# Patient Record
Sex: Male | Born: 1990 | Race: White | Hispanic: No | Marital: Married | Smoking: Former smoker
Health system: Southern US, Community
[De-identification: ages and names within clinical notes are randomized; demographics above are authoritative.]

## PROBLEM LIST (undated history)

## (undated) DIAGNOSIS — F09 Unspecified mental disorder due to known physiological condition: Secondary | ICD-10-CM

## (undated) DIAGNOSIS — E291 Testicular hypofunction: Secondary | ICD-10-CM

## (undated) DIAGNOSIS — K219 Gastro-esophageal reflux disease without esophagitis: Secondary | ICD-10-CM

## (undated) DIAGNOSIS — I10 Essential (primary) hypertension: Secondary | ICD-10-CM

## (undated) DIAGNOSIS — N62 Hypertrophy of breast: Secondary | ICD-10-CM

## (undated) DIAGNOSIS — R4189 Other symptoms and signs involving cognitive functions and awareness: Secondary | ICD-10-CM

## (undated) DIAGNOSIS — E669 Obesity, unspecified: Secondary | ICD-10-CM

## (undated) DIAGNOSIS — R1013 Epigastric pain: Secondary | ICD-10-CM

## (undated) DIAGNOSIS — E049 Nontoxic goiter, unspecified: Secondary | ICD-10-CM

## (undated) DIAGNOSIS — E7801 Familial hypercholesterolemia: Secondary | ICD-10-CM

## (undated) DIAGNOSIS — R7303 Prediabetes: Secondary | ICD-10-CM

## (undated) DIAGNOSIS — E300 Delayed puberty: Secondary | ICD-10-CM

## (undated) DIAGNOSIS — L83 Acanthosis nigricans: Secondary | ICD-10-CM

## (undated) DIAGNOSIS — G930 Cerebral cysts: Secondary | ICD-10-CM

## (undated) HISTORY — DX: Testicular hypofunction: E29.1

## (undated) HISTORY — DX: Cerebral cysts: G93.0

## (undated) HISTORY — DX: Other symptoms and signs involving cognitive functions and awareness: R41.89

## (undated) HISTORY — DX: Epigastric pain: R10.13

## (undated) HISTORY — PX: EYE MUSCLE SURGERY: SHX370

## (undated) HISTORY — DX: Acanthosis nigricans: L83

## (undated) HISTORY — DX: Prediabetes: R73.03

## (undated) HISTORY — DX: Obesity, unspecified: E66.9

## (undated) HISTORY — DX: Familial hypercholesterolemia: E78.01

## (undated) HISTORY — PX: ORCHIOPEXY: SHX479

## (undated) HISTORY — DX: Unspecified mental disorder due to known physiological condition: F09

## (undated) HISTORY — DX: Essential (primary) hypertension: I10

## (undated) HISTORY — DX: Delayed puberty: E30.0

## (undated) HISTORY — DX: Gastro-esophageal reflux disease without esophagitis: K21.9

## (undated) HISTORY — DX: Hypertrophy of breast: N62

## (undated) HISTORY — DX: Nontoxic goiter, unspecified: E04.9

---
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
## 2010-02-25 ENCOUNTER — Ambulatory Visit: Payer: Self-pay | Admitting: "Endocrinology

## 2010-08-27 ENCOUNTER — Ambulatory Visit: Payer: Self-pay | Admitting: "Endocrinology

## 2010-10-11 NOTE — Op Note (Signed)
NAME:  Stephen Miles, Stephen Miles                        ACCOUNT NO.:  [id number]   MEDICAL RECORD NO.:  [id number]                   PATIENT TYPE:  AMB   LOCATION:  DSC                                  FACILITY:  [facility]   PHYSICIAN:  Pasty Spillers. Maple Hudson, M.D.              DATE OF BIRTH:  [birth year]   DATE OF PROCEDURE:  06/16/2003  DATE OF DISCHARGE:                                 OPERATIVE REPORT   PREOPERATIVE DIAGNOSIS:  Exotropia.   POSTOPERATIVE DIAGNOSIS:  Exotropia.   PROCEDURE:  Lateral rectus muscle recession, 8.5 mm OU.   SURGEON:  Pasty Spillers. Maple Hudson, M.D.   ANESTHESIA:  General (laryngeal mask).   COMPLICATIONS:  None.   DESCRIPTION OF PROCEDURE:  After routine preoperative evaluation including  informed consent from the parents, the patient was taken to the operating  room where he was identified by me.  General anesthesia was induced without  difficulty after placement of appropriate monitors.  The patient was prepped  and draped in standard sterile fashion.  A lid speculum was placed in the  right eye.   Through an inferotemporal fornix incision through conjunctiva and Tenon's  fascia, the right lateral rectus muscle was engaged on a series of muscle  hooks and carefully cleared of its fascial attachments.  The tendon was  secured with a double armed 6-0 Vicryl suture, the double locking bite at  each border of the tendon.  The muscle was disinserted and was reattached to  sclera at a measured distance of 8.5 mm posterior to the original insertion,  using direct scleral passes in cross swords fashion.  The suture ends were  tied securely after the position of the muscle had been checked and found to  be accurate.  The conjunctiva was closed with two interrupted 6-0 Vicryl  sutures.  The lid speculum was transferred to the left eye, an identical  procedure was performed, again effecting an 8.5 mm recession of the lateral  rectus muscle.  TobraDex ointment was placed in each  eye.  The patient was  awakened without difficulty and taken to the recovery room in stable  condition, having suffered no interoperative or immediate postoperative  complications.                                               Pasty Spillers. Maple Hudson, M.D.    Cheron Schaumann  D:  06/16/2003  T:  06/16/2003  Job:  [id number]

## 2010-11-11 ENCOUNTER — Encounter: Payer: Self-pay | Admitting: *Deleted

## 2010-11-11 DIAGNOSIS — R7303 Prediabetes: Secondary | ICD-10-CM | POA: Insufficient documentation

## 2010-11-11 DIAGNOSIS — E669 Obesity, unspecified: Secondary | ICD-10-CM | POA: Insufficient documentation

## 2010-11-11 DIAGNOSIS — E049 Nontoxic goiter, unspecified: Secondary | ICD-10-CM | POA: Insufficient documentation

## 2010-11-11 DIAGNOSIS — I10 Essential (primary) hypertension: Secondary | ICD-10-CM | POA: Insufficient documentation

## 2010-12-10 ENCOUNTER — Ambulatory Visit (INDEPENDENT_AMBULATORY_CARE_PROVIDER_SITE_OTHER): Payer: Medicaid Other | Admitting: "Endocrinology

## 2010-12-10 VITALS — BP 148/76 | HR 80 | Wt 273.6 lb

## 2010-12-10 DIAGNOSIS — E669 Obesity, unspecified: Secondary | ICD-10-CM

## 2010-12-10 DIAGNOSIS — R7303 Prediabetes: Secondary | ICD-10-CM

## 2010-12-10 DIAGNOSIS — I10 Essential (primary) hypertension: Secondary | ICD-10-CM

## 2010-12-10 DIAGNOSIS — E782 Mixed hyperlipidemia: Secondary | ICD-10-CM

## 2010-12-10 DIAGNOSIS — L83 Acanthosis nigricans: Secondary | ICD-10-CM

## 2010-12-10 DIAGNOSIS — E049 Nontoxic goiter, unspecified: Secondary | ICD-10-CM

## 2010-12-10 DIAGNOSIS — R7309 Other abnormal glucose: Secondary | ICD-10-CM

## 2010-12-10 MED ORDER — OMEPRAZOLE 40 MG PO CPDR: 40.0000 mg | DELAYED_RELEASE_CAPSULE | Freq: Every day | ORAL | Status: DC

## 2011-03-26 ENCOUNTER — Other Ambulatory Visit: Payer: Self-pay | Admitting: "Endocrinology

## 2011-03-27 LAB — T4, FREE: Free T4: 0.92 ng/dL (ref 0.80–1.80)

## 2011-03-27 LAB — LIPID PANEL
Cholesterol: 167 mg/dL (ref 0–200)
LDL Cholesterol: 92 mg/dL (ref 0–99)

## 2011-04-07 ENCOUNTER — Encounter: Payer: Self-pay | Admitting: "Endocrinology

## 2011-04-07 ENCOUNTER — Ambulatory Visit (INDEPENDENT_AMBULATORY_CARE_PROVIDER_SITE_OTHER): Payer: Medicare Other | Admitting: "Endocrinology

## 2011-04-07 VITALS — BP 142/86 | HR 89 | Wt 277.1 lb

## 2011-04-07 DIAGNOSIS — K219 Gastro-esophageal reflux disease without esophagitis: Secondary | ICD-10-CM

## 2011-04-07 DIAGNOSIS — R1013 Epigastric pain: Secondary | ICD-10-CM

## 2011-04-07 DIAGNOSIS — R7303 Prediabetes: Secondary | ICD-10-CM

## 2011-04-07 DIAGNOSIS — R7309 Other abnormal glucose: Secondary | ICD-10-CM

## 2011-04-07 DIAGNOSIS — I10 Essential (primary) hypertension: Secondary | ICD-10-CM

## 2011-04-07 DIAGNOSIS — E049 Nontoxic goiter, unspecified: Secondary | ICD-10-CM

## 2011-04-07 DIAGNOSIS — K3189 Other diseases of stomach and duodenum: Secondary | ICD-10-CM

## 2011-04-07 DIAGNOSIS — N62 Hypertrophy of breast: Secondary | ICD-10-CM

## 2011-04-07 DIAGNOSIS — E669 Obesity, unspecified: Secondary | ICD-10-CM

## 2011-04-07 LAB — POCT GLYCOSYLATED HEMOGLOBIN (HGB A1C): Hemoglobin A1C: 5.9

## 2011-04-07 NOTE — Patient Instructions (Addendum)
Followup visit in 4 months. Please try to exercise at least 60 minutes per day.

## 2011-06-01 ENCOUNTER — Encounter: Payer: Self-pay | Admitting: "Endocrinology

## 2011-06-01 DIAGNOSIS — E300 Delayed puberty: Secondary | ICD-10-CM | POA: Insufficient documentation

## 2011-06-01 DIAGNOSIS — R1013 Epigastric pain: Secondary | ICD-10-CM | POA: Insufficient documentation

## 2011-06-01 DIAGNOSIS — F09 Unspecified mental disorder due to known physiological condition: Secondary | ICD-10-CM | POA: Insufficient documentation

## 2011-06-01 DIAGNOSIS — R7303 Prediabetes: Secondary | ICD-10-CM | POA: Insufficient documentation

## 2011-06-01 DIAGNOSIS — E669 Obesity, unspecified: Secondary | ICD-10-CM | POA: Insufficient documentation

## 2011-06-01 DIAGNOSIS — E7801 Familial hypercholesterolemia: Secondary | ICD-10-CM | POA: Insufficient documentation

## 2011-06-01 DIAGNOSIS — K219 Gastro-esophageal reflux disease without esophagitis: Secondary | ICD-10-CM | POA: Insufficient documentation

## 2011-06-01 DIAGNOSIS — E049 Nontoxic goiter, unspecified: Secondary | ICD-10-CM | POA: Insufficient documentation

## 2011-06-01 DIAGNOSIS — G930 Cerebral cysts: Secondary | ICD-10-CM | POA: Insufficient documentation

## 2011-06-01 DIAGNOSIS — I10 Essential (primary) hypertension: Secondary | ICD-10-CM | POA: Insufficient documentation

## 2011-06-01 DIAGNOSIS — L83 Acanthosis nigricans: Secondary | ICD-10-CM | POA: Insufficient documentation

## 2011-06-01 DIAGNOSIS — N62 Hypertrophy of breast: Secondary | ICD-10-CM | POA: Insufficient documentation

## 2011-06-01 DIAGNOSIS — E291 Testicular hypofunction: Secondary | ICD-10-CM | POA: Insufficient documentation
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
## 2011-08-05 ENCOUNTER — Encounter: Payer: Self-pay | Admitting: "Endocrinology

## 2011-08-05 ENCOUNTER — Ambulatory Visit (INDEPENDENT_AMBULATORY_CARE_PROVIDER_SITE_OTHER): Payer: Medicare Other | Admitting: "Endocrinology

## 2011-08-05 VITALS — BP 137/44 | HR 75 | Wt 270.4 lb

## 2011-08-05 DIAGNOSIS — I10 Essential (primary) hypertension: Secondary | ICD-10-CM

## 2011-08-05 DIAGNOSIS — K3189 Other diseases of stomach and duodenum: Secondary | ICD-10-CM

## 2011-08-05 DIAGNOSIS — M25571 Pain in right ankle and joints of right foot: Secondary | ICD-10-CM

## 2011-08-05 DIAGNOSIS — R7303 Prediabetes: Secondary | ICD-10-CM

## 2011-08-05 DIAGNOSIS — R7309 Other abnormal glucose: Secondary | ICD-10-CM

## 2011-08-05 DIAGNOSIS — M25579 Pain in unspecified ankle and joints of unspecified foot: Secondary | ICD-10-CM

## 2011-08-05 DIAGNOSIS — N62 Hypertrophy of breast: Secondary | ICD-10-CM

## 2011-08-05 DIAGNOSIS — E049 Nontoxic goiter, unspecified: Secondary | ICD-10-CM

## 2011-08-05 DIAGNOSIS — R1013 Epigastric pain: Secondary | ICD-10-CM

## 2011-08-05 LAB — GLUCOSE, POCT (MANUAL RESULT ENTRY): POC Glucose: 84

## 2011-08-05 NOTE — Patient Instructions (Signed)
Followup visit in 4 months. Please continue the good work of reducing intake of sugars and starches. Please call Dr. Aldean Baker at [facility] for an appointment.
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
## 2011-10-14 ENCOUNTER — Other Ambulatory Visit: Payer: Self-pay | Admitting: "Endocrinology

## 2011-12-01 ENCOUNTER — Encounter: Payer: Self-pay | Admitting: "Endocrinology

## 2011-12-01 ENCOUNTER — Ambulatory Visit (INDEPENDENT_AMBULATORY_CARE_PROVIDER_SITE_OTHER): Payer: BC Managed Care – PPO | Admitting: "Endocrinology

## 2011-12-01 VITALS — BP 123/63 | HR 76 | Wt 272.0 lb

## 2011-12-01 DIAGNOSIS — R1013 Epigastric pain: Secondary | ICD-10-CM

## 2011-12-01 DIAGNOSIS — I10 Essential (primary) hypertension: Secondary | ICD-10-CM

## 2011-12-01 DIAGNOSIS — K3189 Other diseases of stomach and duodenum: Secondary | ICD-10-CM

## 2011-12-01 DIAGNOSIS — N62 Hypertrophy of breast: Secondary | ICD-10-CM

## 2011-12-01 DIAGNOSIS — E782 Mixed hyperlipidemia: Secondary | ICD-10-CM

## 2011-12-01 DIAGNOSIS — F17200 Nicotine dependence, unspecified, uncomplicated: Secondary | ICD-10-CM

## 2011-12-01 DIAGNOSIS — E230 Hypopituitarism: Secondary | ICD-10-CM

## 2011-12-01 DIAGNOSIS — Z720 Tobacco use: Secondary | ICD-10-CM

## 2011-12-01 DIAGNOSIS — E049 Nontoxic goiter, unspecified: Secondary | ICD-10-CM

## 2011-12-01 DIAGNOSIS — R7309 Other abnormal glucose: Secondary | ICD-10-CM

## 2011-12-01 DIAGNOSIS — E669 Obesity, unspecified: Secondary | ICD-10-CM

## 2011-12-01 DIAGNOSIS — L83 Acanthosis nigricans: Secondary | ICD-10-CM

## 2011-12-01 DIAGNOSIS — R7303 Prediabetes: Secondary | ICD-10-CM

## 2011-12-01 DIAGNOSIS — E236 Other disorders of pituitary gland: Secondary | ICD-10-CM

## 2011-12-01 LAB — GLUCOSE, POCT (MANUAL RESULT ENTRY): POC Glucose: 86 mg/dl (ref 70–99)

## 2011-12-01 LAB — POCT GLYCOSYLATED HEMOGLOBIN (HGB A1C): Hemoglobin A1C: 5
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
## 2011-12-01 NOTE — Patient Instructions (Signed)
Follow-up visit in 4 months. Please have lab tests done 1-2 weeks prior.  Fast after 10 PM, except for water, on the night prior to lab tests.

## 2012-04-15 ENCOUNTER — Ambulatory Visit: Payer: PRIVATE HEALTH INSURANCE | Admitting: "Endocrinology

## 2012-05-29 ENCOUNTER — Other Ambulatory Visit: Payer: Self-pay | Admitting: "Endocrinology

## 2012-07-22 ENCOUNTER — Other Ambulatory Visit: Payer: Self-pay | Admitting: *Deleted

## 2012-07-22 DIAGNOSIS — R7309 Other abnormal glucose: Secondary | ICD-10-CM

## 2012-08-05 LAB — LIPID PANEL
Total CHOL/HDL Ratio: 4.3 Ratio
Triglycerides: 137 mg/dL (ref <150)

## 2012-08-05 LAB — COMPREHENSIVE METABOLIC PANEL
ALT: 26 U/L (ref 0–53)
Alkaline Phosphatase: 63 U/L (ref 39–117)
BUN: 10 mg/dL (ref 6–23)
CO2: 31 mEq/L (ref 19–32)
Calcium: 9.7 mg/dL (ref 8.4–10.5)
Creat: 0.63 mg/dL (ref 0.50–1.35)
Glucose, Bld: 87 mg/dL (ref 70–99)
Sodium: 138 mEq/L (ref 135–145)
Total Protein: 7.3 g/dL (ref 6.0–8.3)

## 2012-08-05 LAB — T3, FREE: T3, Free: 3.8 pg/mL (ref 2.3–4.2)

## 2012-08-05 LAB — T4, FREE: Free T4: 1.05 ng/dL (ref 0.80–1.80)

## 2012-08-05 LAB — LUTEINIZING HORMONE: LH: 8.1 m[IU]/mL (ref 1.5–9.3)

## 2012-08-06 LAB — TESTOSTERONE, FREE, TOTAL, SHBG: Sex Hormone Binding: 27 nmol/L (ref 13–71)

## 2012-08-17 ENCOUNTER — Ambulatory Visit: Payer: PRIVATE HEALTH INSURANCE | Admitting: "Endocrinology

## 2012-09-20 ENCOUNTER — Encounter: Payer: Self-pay | Admitting: "Endocrinology

## 2012-09-20 ENCOUNTER — Ambulatory Visit
Admission: RE | Admit: 2012-09-20 | Discharge: 2012-09-20 | Disposition: A | Discharge status: Home / Self Care | Payer: BC Managed Care – PPO | Source: Ambulatory Visit | Attending: "Endocrinology | Admitting: "Endocrinology

## 2012-09-20 ENCOUNTER — Ambulatory Visit (INDEPENDENT_AMBULATORY_CARE_PROVIDER_SITE_OTHER): Payer: BC Managed Care – PPO | Admitting: "Endocrinology

## 2012-09-20 VITALS — BP 141/76 | HR 91 | Wt 268.0 lb

## 2012-09-20 DIAGNOSIS — R7309 Other abnormal glucose: Secondary | ICD-10-CM

## 2012-09-20 DIAGNOSIS — G4710 Hypersomnia, unspecified: Secondary | ICD-10-CM

## 2012-09-20 DIAGNOSIS — I10 Essential (primary) hypertension: Secondary | ICD-10-CM

## 2012-09-20 DIAGNOSIS — K219 Gastro-esophageal reflux disease without esophagitis: Secondary | ICD-10-CM

## 2012-09-20 DIAGNOSIS — L83 Acanthosis nigricans: Secondary | ICD-10-CM

## 2012-09-20 DIAGNOSIS — E669 Obesity, unspecified: Secondary | ICD-10-CM

## 2012-09-20 DIAGNOSIS — R7303 Prediabetes: Secondary | ICD-10-CM

## 2012-09-20 DIAGNOSIS — N62 Hypertrophy of breast: Secondary | ICD-10-CM

## 2012-09-20 DIAGNOSIS — E236 Other disorders of pituitary gland: Secondary | ICD-10-CM

## 2012-09-20 DIAGNOSIS — E049 Nontoxic goiter, unspecified: Secondary | ICD-10-CM

## 2012-09-20 DIAGNOSIS — N509 Disorder of male genital organs, unspecified: Secondary | ICD-10-CM

## 2012-09-20 DIAGNOSIS — E230 Hypopituitarism: Secondary | ICD-10-CM

## 2012-09-20 DIAGNOSIS — E782 Mixed hyperlipidemia: Secondary | ICD-10-CM

## 2012-09-20 MED ORDER — LANSOPRAZOLE 30 MG PO CPDR: DELAYED_RELEASE_CAPSULE | ORAL | Status: DC
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
## 2012-09-20 NOTE — Patient Instructions (Signed)
Follow up in 2 months. Call Dr. Fransico Michael in 2 weeks with a progress report.

## 2012-09-30 ENCOUNTER — Telehealth: Payer: Self-pay | Admitting: "Endocrinology

## 2012-09-30 NOTE — Telephone Encounter (Signed)
I called the patient, but he was not available. I left a VM message. 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 I believe that this is a benign variant of normal. Please let me know if you still want to see a urologist. I will be out of town next week, but will be back in the office on 10/12/12. David Stall

## 2012-12-30 ENCOUNTER — Ambulatory Visit: Payer: PRIVATE HEALTH INSURANCE | Admitting: "Endocrinology

## 2013-01-17 ENCOUNTER — Ambulatory Visit: Payer: BC Managed Care – PPO | Admitting: "Endocrinology

## 2013-02-02 ENCOUNTER — Ambulatory Visit (INDEPENDENT_AMBULATORY_CARE_PROVIDER_SITE_OTHER): Payer: Medicaid Other | Admitting: Pulmonary Disease

## 2013-02-02 ENCOUNTER — Encounter: Payer: Self-pay | Admitting: Pulmonary Disease

## 2013-02-02 VITALS — BP 136/78 | HR 97 | Temp 97.8°F | Ht 72.0 in | Wt 273.2 lb

## 2013-02-02 DIAGNOSIS — G4733 Obstructive sleep apnea (adult) (pediatric): Secondary | ICD-10-CM | POA: Insufficient documentation

## 2013-02-02 NOTE — Patient Instructions (Addendum)
You have severe obstructive sleep apnea We will get you started on a CPAP machine CPAP titration study will be scheduled DO not drive until your sleepiness is gone with CPAP therapy

## 2013-02-02 NOTE — Assessment & Plan Note (Addendum)
You have severe obstructive sleep apnea We will get you started on a CPAP machine CPAP titration study will be scheduled In the absence of cataplexy, I doubt narcolepsy here - also note the prolonged REM latency - we'll also pay attention to this on the titration study  Given excessive daytime somnolence, narrow pharyngeal exam, witnessed apneas & loud snoring, obstructive sleep apnea is very likely & an overnight polysomnogram will be scheduled as a split study. The pathophysiology of obstructive sleep apnea , it's cardiovascular consequences & modes of treatment including CPAP were discused with the patient in detail & they evidenced understanding. Weight loss encouraged, compliance with goal of at least 4-6 hrs every night is the expectation. Advised against medications with sedative side effects Cautioned against driving when sleepy - understanding that sleepiness will vary on a day to day basis
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
## 2013-02-03 ENCOUNTER — Ambulatory Visit (HOSPITAL_BASED_OUTPATIENT_CLINIC_OR_DEPARTMENT_OTHER): Payer: Medicare Other | Attending: Pulmonary Disease | Admitting: Radiology

## 2013-02-03 VITALS — Ht 72.0 in | Wt 273.0 lb

## 2013-02-03 DIAGNOSIS — G4733 Obstructive sleep apnea (adult) (pediatric): Secondary | ICD-10-CM | POA: Diagnosis present

## 2013-02-14 ENCOUNTER — Telehealth: Payer: Self-pay | Admitting: Pulmonary Disease

## 2013-02-14 DIAGNOSIS — G4710 Hypersomnia, unspecified: Secondary | ICD-10-CM

## 2013-02-14 DIAGNOSIS — G4730 Sleep apnea, unspecified: Secondary | ICD-10-CM

## 2013-02-14 DIAGNOSIS — G4733 Obstructive sleep apnea (adult) (pediatric): Secondary | ICD-10-CM

## 2013-02-14 NOTE — Telephone Encounter (Signed)
Rx for CPAP 12 cm , med FF mask sent to DME - download & FU in 1 mnth

## 2013-02-15 NOTE — Telephone Encounter (Signed)
I spoke with patient about results and he verbalized understanding and had no questions and appt scheduled

## 2013-02-15 NOTE — Procedures (Signed)
NAME:  Stephen Miles, Stephen Miles NO.:  [id number]  MEDICAL RECORD NO.:  [id number]          PATIENT TYPE:  OUT  LOCATION:  SLEEP CENTER                 FACILITY:  [facility]  PHYSICIAN:  Oretha Milch, MD      DATE OF BIRTH:  08/08/1990  DATE OF STUDY:  02/14/2013                           NOCTURNAL POLYSOMNOGRAM  REFERRING PHYSICIAN:  Oretha Milch, MD  INDICATION FOR THE STUDY:  Hodge is a 22 year old with severe obstructive sleep apnea, documented on a baseline study done in  with reported loud snoring, excessive daytime somnolence.  At the time of this study, he weighed 273 pounds with a height of 6 feet, BMI of 37, neck size of 17.5 inches.  EPWORTH SLEEPINESS SCORE:  24 seconds.  This CPAP titration study was performed with a sleep technologist in attendance.  EEG, EOG, EMG, EKG, and respiratory parameters were recorded.  Sleep stages, arousals, limb movements, and respiratory data were scored according to criteria laid out by the American Academy of Sleep Medicine.  SLEEP ARCHITECTURE:  Lights out was at 10:14 p.m., lights on was at 5:04 a.m.  Total sleep time was 395 minutes with a sleep period time of 396 minutes and sleep efficiency of 97%.  Sleep latency was 13 minutes. Latency to REM sleep was 33 minutes and wake after sleep onset was 1 minute.  Sleep stages as a percentage of total sleep time was N1 0.6%, N2 52%, N3 0.5% and REM sleep 160 minutes (41%), supine REM sleep accounted for 160 minutes.  Longest period of REM sleep was around 3:30 a.m.  Long periods of REM rebound were noted.  Arousal Data:  There were 32 arousals with an arousal index of 5 events per hour.  Most of these were spontaneous.  RESPIRATORY DATA:  CPAP was initiated at 5 cm with a medium full-face mask and titrated to a final level of 13 cm due to events and snoring at a level of 11 cm for 233 minutes including 97 minutes of REM sleep, 1 hypopnea was noted with a lowest  desaturation of 93%.  At a level of 12 cm for 107 minutes including 63 minutes of REM sleep.  No events or desats were noted.  This appears to be the optimal level used during the study.  Although, CPAP was titrated further to 13 cm.  OXYGEN DATA:  The desaturation index was 1.7 events per hour with a lowest desaturation of 90%.  CARDIAC DATA:  The low heart rate was 36 beats per minute and high heart rate was 111 beats per minute.  No arrhythmias were noted.  MOVEMENT-PARASOMNIA:  The PLM index was 13 events per hour.  None of these were associated with arousals.  DISCUSSION:  He was desensitized with a medium Quattro full-face mask. REM rebound was noted.  CPAP titration appears to be optimal.  IMPRESSIONS-RECOMMENDATIONS: 1. Severe obstructive sleep apnea, corrected by CPAP of 12 cm with a     medium full-face mask. 2. No evidence of cardiac arrhythmias or behavioral disturbance during     sleep 3. Although limb movements, few limb movements were noted.  These were     not associated  with arousals.  Significance is unclear.  RECOMMENDATIONS: 1. CPAP should be initiated at 12 cm with a medium full-face mask.     Compliance should be monitored at this level. 2. He should be cautioned against driving when sleepy 3. He should be asked to avoid medications with sedative side effects     and weight loss should be encouraged.     Oretha Milch, MD    RVA/MEDQ  D:  02/14/2013 12:05:29  T:  02/15/2013 02:19:02  Job:  [id number]

## 2013-03-04 ENCOUNTER — Encounter: Payer: Self-pay | Admitting: Pulmonary Disease

## 2013-03-04 ENCOUNTER — Ambulatory Visit (INDEPENDENT_AMBULATORY_CARE_PROVIDER_SITE_OTHER): Payer: PRIVATE HEALTH INSURANCE | Admitting: Pulmonary Disease

## 2013-03-04 VITALS — BP 138/78 | HR 90 | Ht 72.0 in | Wt 279.2 lb

## 2013-03-04 DIAGNOSIS — G4733 Obstructive sleep apnea (adult) (pediatric): Secondary | ICD-10-CM

## 2013-03-04 DIAGNOSIS — K219 Gastro-esophageal reflux disease without esophagitis: Secondary | ICD-10-CM

## 2013-03-04 NOTE — Assessment & Plan Note (Signed)
CPAP 12 cm New med FF mask for leak Review download for compliance - if good , may have to consider nuvigil for persistent excessive daytime somnolence   Weight loss encouraged, compliance with goal of at least 4-6 hrs every night is the expectation. Advised against medications with sedative side effects Cautioned against driving when sleepy - understanding that sleepiness will vary on a day to day basis

## 2013-03-04 NOTE — Patient Instructions (Signed)
New full face mask Your cpaP is set at 12 cm We will look at download report & give you feedback
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
## 2013-03-04 NOTE — Assessment & Plan Note (Signed)
GI referral - reports 1 episode of hematemesis No response to prevacid

## 2013-03-08 ENCOUNTER — Encounter: Payer: Self-pay | Admitting: Gastroenterology

## 2013-03-24 ENCOUNTER — Ambulatory Visit: Payer: Medicaid Other | Admitting: Pulmonary Disease

## 2013-04-04 ENCOUNTER — Ambulatory Visit: Payer: BC Managed Care – PPO | Admitting: "Endocrinology

## 2013-04-12 ENCOUNTER — Encounter: Payer: Self-pay | Admitting: Gastroenterology

## 2013-04-12 ENCOUNTER — Ambulatory Visit (INDEPENDENT_AMBULATORY_CARE_PROVIDER_SITE_OTHER): Payer: PRIVATE HEALTH INSURANCE | Admitting: Gastroenterology

## 2013-04-12 VITALS — BP 140/60 | HR 84 | Ht 72.0 in | Wt 281.0 lb

## 2013-04-12 DIAGNOSIS — K920 Hematemesis: Secondary | ICD-10-CM

## 2013-04-12 DIAGNOSIS — R1319 Other dysphagia: Secondary | ICD-10-CM

## 2013-04-12 DIAGNOSIS — K219 Gastro-esophageal reflux disease without esophagitis: Secondary | ICD-10-CM

## 2013-04-12 MED ORDER — DEXLANSOPRAZOLE 60 MG PO CPDR: 60.0000 mg | DELAYED_RELEASE_CAPSULE | Freq: Every day | ORAL | Status: DC

## 2013-04-12 NOTE — Progress Notes (Signed)
    History of Present Illness: This is a 22 year old male who relates a several year history of postprandial reflux symptoms. Over the past few months he has developed intermittent nausea and vomiting, solid food dysphagia and occasional hematemesis following repeated episodes of vomiting. He was treated with Prevacid without response. He is no longer taking any acid medications and he has discontinued all of his medications. Denies weight loss, abdominal pain, constipation, diarrhea, change in stool caliber, melena, hematochezia, chest pain.  Review of Systems: Pertinent positive and negative review of systems were noted in the above HPI section. All other review of systems were otherwise negative.  Current Medications, Allergies, Past Medical History, Past Surgical History, Family History and Social History were reviewed in [employer] record.  Physical Exam: General: Well developed , well nourished, no acute distress Head: Normocephalic and atraumatic Eyes:  sclerae anicteric, EOMI Ears: Normal auditory acuity Mouth: No deformity or lesions Neck: Supple, no masses or thyromegaly Lungs: Clear throughout to auscultation Heart: Regular rate and rhythm; no murmurs, rubs or bruits Abdomen: Soft, non tender and non distended. No masses, hepatosplenomegaly or hernias noted. Normal Bowel sounds Musculoskeletal: Symmetrical with no gross deformities  Skin: No lesions on visible extremities Pulses:  Normal pulses noted Extremities: No clubbing, cyanosis, edema or deformities noted Neurological: Alert oriented x 4, grossly nonfocal Cervical Nodes:  No significant cervical adenopathy Inguinal Nodes: No significant inguinal adenopathy Psychological:  Alert and cooperative. Normal mood and affect  Assessment and Recommendations:  1. Worsening nausea and vomiting associated with dysphagia and hematemesis. Rule out esophagitis, stricture, ulcer. Avoid ASA and NSAIDs. Begin  Dexilant 60 mg daily. Follow antireflux measures. Schedule upper endoscopy. The risks, benefits, and alternatives to endoscopy with possible biopsy and possible dilation were discussed with the patient and they consent to proceed. He was advised to resume all prescribed medications and to followup with Drs. Fransico Michael and [place].

## 2013-04-12 NOTE — Patient Instructions (Signed)
You need to restart ALL of your medications such as lisinopril and metformin.   You have been given samples of Dexilant to take one tablet by mouth once daily until your Upper Endoscopy.   You have been scheduled for an endoscopy with propofol. Please follow written instructions given to you at your visit today. If you use inhalers (even only as needed), please bring them with you on the day of your procedure.  Patient advised to avoid spicy, acidic, citrus, chocolate, mints, fruit and fruit juices.  Limit the intake of caffeine, alcohol and Soda.  Don't exercise too soon after eating.  Don't lie down within 3-4 hours of eating.  Elevate the head of your bed.  Thank you for choosing me and [facility].  Venita Lick. Pleas Koch., MD., Clementeen Graham  cc: Alena Bills, MD

## 2013-04-17 ENCOUNTER — Other Ambulatory Visit: Payer: Self-pay | Admitting: "Endocrinology

## 2013-04-19 ENCOUNTER — Encounter: Payer: Self-pay | Admitting: Pediatrics

## 2013-04-21 ENCOUNTER — Telehealth: Payer: Self-pay | Admitting: Pulmonary Disease

## 2013-04-21 NOTE — Telephone Encounter (Signed)
Download 04/13/13 - CPAP 12 cm very effective when used, about 6h, no residuals, acceptable elak Should use every night

## 2013-04-22 NOTE — Telephone Encounter (Signed)
lmomtcb x1 for pt 

## 2013-04-26 NOTE — Telephone Encounter (Signed)
I spoke with patient about results and he verbalized understanding and had no questions 

## 2013-05-02 ENCOUNTER — Ambulatory Visit (AMBULATORY_SURGERY_CENTER): Payer: PRIVATE HEALTH INSURANCE | Admitting: Gastroenterology

## 2013-05-02 ENCOUNTER — Encounter: Payer: Self-pay | Admitting: Gastroenterology

## 2013-05-02 VITALS — BP 114/60 | HR 77 | Temp 97.7°F | Resp 19 | Ht 72.0 in | Wt 281.0 lb

## 2013-05-02 DIAGNOSIS — K920 Hematemesis: Secondary | ICD-10-CM

## 2013-05-02 DIAGNOSIS — R112 Nausea with vomiting, unspecified: Secondary | ICD-10-CM

## 2013-05-02 DIAGNOSIS — K219 Gastro-esophageal reflux disease without esophagitis: Secondary | ICD-10-CM

## 2013-05-02 DIAGNOSIS — R1319 Other dysphagia: Secondary | ICD-10-CM

## 2013-05-02 NOTE — Progress Notes (Signed)
Patient did not experience any of the following events: a burn prior to discharge; a fall within the facility; wrong site/side/patient/procedure/implant event; or a hospital transfer or hospital admission upon discharge from the facility. (G8907) Patient did not have preoperative order for IV antibiotic SSI prophylaxis. (G8918)  

## 2013-05-02 NOTE — Op Note (Signed)
[facility] [address] [place], [address]   ENDOSCOPY PROCEDURE REPORT  PATIENT: Stephen Miles, Stephen Miles  MR#: [id number] BIRTHDATE: 1990/11/10 , 22  yrs. old GENDER: Male ENDOSCOPIST: Meryl Dare, MD, [facility] REFERRED BY:  Molli Knock, M.D. PROCEDURE DATE:  05/02/2013 PROCEDURE:  EGD, diagnostic and Savary dilation of esophagus ASA CLASS:     Class III INDICATIONS:  Dysphagia.   Hematemesis.   History of esophageal reflux.   Nausea.   Vomiting. MEDICATIONS: MAC sedation, administered by CRNA and propofol (Diprivan) 300mg  IV TOPICAL ANESTHETIC: Cetacaine Spray DESCRIPTION OF PROCEDURE: After the risks benefits and alternatives of the procedure were thoroughly explained, informed consent was obtained.  The LB [id number] endoscope was introduced through the mouth and advanced to the second portion of the duodenum. Without limitations.  The instrument was slowly withdrawn as the mucosa was fully examined.  ESOPHAGUS: A subtle stricture was found at the gastroesophageal junction. It measured 15 mm in diameter. The stenosis was mild and easily traversable with the endoscope.  The esophagus was otherwise normal. STOMACH: The mucosa and folds of the stomach appeared normal. DUODENUM: Abnormal mucosa was found. The duodenal mucosa showed no abnormalities in the bulb and second portion of the duodenum. Retroflexed views revealed a small hiatal hernia. A guidewire was passed and the scope was then withdrawn from the patient. A 17 mm Savary dilator was passed with no resistance and no heme noted and the procedure completed.  COMPLICATIONS: There were no complications.  ENDOSCOPIC IMPRESSION: 1.   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   Small hiatal hernia  RECOMMENDATIONS: 1.  Anti-reflux regimen long term 2.  Continue PPI long term 3.  Post dilation instructions 4.  His GI symptoms are out of proportion to the endoscopic  findings  eSigned:  Meryl Dare, MD, [facility] 05/02/2013 3:25 PM

## 2013-05-02 NOTE — Progress Notes (Signed)
Called to room to assist during endoscopic procedure.  Patient ID and intended procedure confirmed with present staff. Received instructions for my participation in the procedure from the performing physician.  

## 2013-05-02 NOTE — Patient Instructions (Signed)
Discharge instructions given with verbal understanding. Handout on a hiatal hernia and a dilatation diet. Resume previous medications. YOU HAD AN ENDOSCOPIC PROCEDURE TODAY AT THE [facility]: Refer to the procedure report that was given to you for any specific questions about what was found during the examination.  If the procedure report does not answer your questions, please call your gastroenterologist to clarify.  If you requested that your care partner not be given the details of your procedure findings, then the procedure report has been included in a sealed envelope for you to review at your convenience later.  YOU SHOULD EXPECT: Some feelings of bloating in the abdomen. Passage of more gas than usual.  Walking can help get rid of the air that was put into your GI tract during the procedure and reduce the bloating. If you had a lower endoscopy (such as a colonoscopy or flexible sigmoidoscopy) you may notice spotting of blood in your stool or on the toilet paper. If you underwent a bowel prep for your procedure, then you may not have a normal bowel movement for a few days.  DIET: Your first meal following the procedure should be a light meal and then it is ok to progress to your normal diet.  A half-sandwich or bowl of soup is an example of a good first meal.  Heavy or fried foods are harder to digest and may make you feel nauseous or bloated.  Likewise meals heavy in dairy and vegetables can cause extra gas to form and this can also increase the bloating.  Drink plenty of fluids but you should avoid alcoholic beverages for 24 hours.  ACTIVITY: Your care partner should take you home directly after the procedure.  You should plan to take it easy, moving slowly for the rest of the day.  You can resume normal activity the day after the procedure however you should NOT DRIVE or use heavy machinery for 24 hours (because of the sedation medicines used during the test).    SYMPTOMS TO REPORT  IMMEDIATELY: A gastroenterologist can be reached at any hour.  During normal business hours, 8:30 AM to 5:00 PM Monday through Friday, call [phone].  After hours and on weekends, please call the GI answering service at [phone] who will take a message and have the physician on call contact you.   Following upper endoscopy (EGD)  Vomiting of blood or coffee ground material  New chest pain or pain under the shoulder blades  Painful or persistently difficult swallowing  New shortness of breath  Fever of 100F or higher  Black, tarry-looking stools  FOLLOW UP: If any biopsies were taken you will be contacted by phone or by letter within the next 1-3 weeks.  Call your gastroenterologist if you have not heard about the biopsies in 3 weeks.  Our staff will call the home number listed on your records the next business day following your procedure to check on you and address any questions or concerns that you may have at that time regarding the information given to you following your procedure. This is a courtesy call and so if there is no answer at the home number and we have not heard from you through the emergency physician on call, we will assume that you have returned to your regular daily activities without incident.  SIGNATURES/CONFIDENTIALITY: You and/or your care partner have signed paperwork which will be entered into your electronic medical record.  These signatures attest to the fact that  that the information above on your After Visit Summary has been reviewed and is understood.  Full responsibility of the confidentiality of this discharge information lies with you and/or your care-partner.

## 2013-05-03 ENCOUNTER — Telehealth: Payer: Self-pay

## 2013-05-03 NOTE — Telephone Encounter (Signed)
No answer, left message

## 2015-06-05 DIAGNOSIS — M216X1 Other acquired deformities of right foot: Secondary | ICD-10-CM | POA: Insufficient documentation

## 2015-06-19 ENCOUNTER — Ambulatory Visit (INDEPENDENT_AMBULATORY_CARE_PROVIDER_SITE_OTHER): Payer: Medicare Other | Admitting: Diagnostic Neuroimaging

## 2015-06-19 ENCOUNTER — Encounter: Payer: Self-pay | Admitting: Diagnostic Neuroimaging

## 2015-06-19 VITALS — BP 156/67 | HR 85 | Ht 72.0 in | Wt 278.8 lb

## 2015-06-19 DIAGNOSIS — M5441 Lumbago with sciatica, right side: Secondary | ICD-10-CM | POA: Diagnosis not present

## 2015-06-19 DIAGNOSIS — M2110 Varus deformity, not elsewhere classified, unspecified site: Secondary | ICD-10-CM

## 2015-06-19 DIAGNOSIS — F09 Unspecified mental disorder due to known physiological condition: Secondary | ICD-10-CM | POA: Diagnosis not present

## 2015-06-19 DIAGNOSIS — Q661 Congenital talipes calcaneovarus, unspecified foot: Secondary | ICD-10-CM

## 2015-06-19 NOTE — Progress Notes (Signed)
[facility]  PATIENT: Stephen Miles DOB: Oct 10, 1990  REFERRING CLINICIAN: Ezzard Standing HISTORY FROM: patient and wife REASON FOR VISIT: new consult    HISTORICAL  CHIEF COMPLAINT:  Chief Complaint  Patient presents with  . 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 Prior notes were reviewed. Apparently patient was diagnosed with learning disabilities, low IQ, ADHD, hypogonadism. Around early teenage years patient noticed he was walking on lateral aspect of his right foot. Over time he developed calluses along the right side of his foot. His foot continued to invert over time slowly. Now he has significant ankle and foot pain. He presented to orthopedic and foot specialist for surgical evaluation. He tried orthotics without benefit.   Around age of 25 years old patient was diagnosed diabetes. Patient has mild diabetic neuropathy symptoms.   REVIEW OF SYSTEMS: Full 14 system review of systems performed and notable only for as per history of present illness.  ALLERGIES: No Known Allergies  HOME MEDICATIONS: Outpatient Prescriptions Prior to Visit  Medication Sig Dispense Refill  . dexlansoprazole (DEXILANT) 60 MG capsule Take 1 capsule (60 mg total) by mouth daily. 90 capsule 3   No facility-administered medications prior to visit.    PAST MEDICAL HISTORY: Past Medical History  Diagnosis Date  . Puberty delay   . Goiter   . Obesity (BMI 30-39.9)   . Acanthosis   . Hypogonadism male   . Dyspepsia   . Hyperlipidemia type II   . Prediabetes   . Brain cyst   . GERD (gastroesophageal reflux disease)   . Gynecomastia, male   . Hypertension   . Cognitive deficits   . Organic brain syndrome (chronic)     PAST SURGICAL HISTORY: Past Surgical History  Procedure Laterality Date  . Orchiopexy    . Eye muscle  surgery      FAMILY HISTORY: Family History  Problem Relation Age of Onset  . Diabetes Mother   . Diabetes Sister   . Thyroid disease Paternal Grandfather   . Cancer Paternal Grandmother     SOCIAL HISTORY:  Social History   Social History  . Marital Status: Married    Spouse Name: Kenney Houseman  . Number of Children: 1  . Years of Education: 12   Occupational History  . CNA     [employer]   Social History Main Topics  . Smoking status: Former Smoker -- 2 years    Types: Cigarettes  . Smokeless tobacco: Never Used     Comment: occasionally  . Alcohol Use: No  . Drug Use: No  . Sexual Activity: Not on file   Other Topics Concern  . Not on file   Social History Narrative   Lives at home with wife, son   Caffeine use- coffee, tea- 1 daily     PHYSICAL EXAM  GENERAL EXAM/CONSTITUTIONAL: Vitals:  Filed Vitals:   06/19/15 1049  BP: 156/67  Pulse: 85  Height: 6' (1.829 m)  Weight: 278 lb 12.8 oz (126.463 kg)     Body mass index is 37.8 kg/(m^2).  Visual Acuity Screening   Right eye Left eye Both eyes  Without correction: 20/100 20/30   With correction:     Comments: 06/19/15 forgot his glasses    Patient is in no distress; well developed, nourished and groomed; neck is supple  CARDIOVASCULAR:  Examination of carotid arteries is normal; no carotid bruits  Regular rate and rhythm, no murmurs  Examination of peripheral vascular system by observation and palpation is normal  EYES:  Ophthalmoscopic exam of optic discs and posterior segments is normal; no papilledema or hemorrhages  MUSCULOSKELETAL:  Gait, strength, tone, movements noted in Neurologic exam below  NEUROLOGIC: MENTAL STATUS:  No flowsheet data found.  awake, alert, oriented to person, place and time  recent and remote memory intact  normal attention and concentration  language fluent, comprehension intact, naming intact,   fund of knowledge appropriate  CRANIAL  NERVE:   2nd - no papilledema on fundoscopic exam  2nd, 3rd, 4th, 6th - pupils equal and reactive to light, visual fields full to confrontation, extraocular muscles intact, no nystagmus; MILD ESOTROPIA OF RIGHT EYE  5th - facial sensation symmetric  7th - facial strength symmetric  8th - hearing intact  9th - palate elevates symmetrically, uvula midline  11th - shoulder shrug symmetric  12th - tongue protrusion midline  MOTOR:   normal bulk and tone, full strength in the BUE, LLE; RLE HF 4+, KE/KF 4+; DECR RIGHT FOOT EVERSION 4 AND DORSIFLEXION 4 (LIMITED ACTIVE AND PASSIVE ROM)  SENSORY:   normal and symmetric to light touch, pinprick, temperature, vibration  COORDINATION:   finger-nose-finger, fine finger movements normal  REFLEXES:   deep tendon reflexes present and symmetric  GAIT/STATION:   WIDE BASED LIMPING GAIT; RIGHT FOOT CAVOVARUS DEFORMITY; CALLUSES ALONG LATERAL ASPECT; MILD LEFT FOOT CAVOVARUS     DIAGNOSTIC DATA (LABS, IMAGING, TESTING) - I reviewed patient records, labs, notes, testing and imaging myself where available.  No results found for: WBC, HGB, HCT, MCV, PLT    Component Value Date/Time   NA 138 08/05/2012 0945   K 3.8 08/05/2012 0945   CL 101 08/05/2012 0945   CO2 31 08/05/2012 0945   GLUCOSE 87 08/05/2012 0945   BUN 10 08/05/2012 0945   CREATININE 0.63 08/05/2012 0945   CALCIUM 9.7 08/05/2012 0945   PROT 7.3 08/05/2012 0945   ALBUMIN 4.5 08/05/2012 0945   AST 21 08/05/2012 0945   ALT 26 08/05/2012 0945   ALKPHOS 63 08/05/2012 0945   BILITOT 0.3 08/05/2012 0945   Lab Results  Component Value Date   CHOL 156 08/05/2012   HDL 36* 08/05/2012   LDLCALC 93 08/05/2012   TRIG 137 08/05/2012   CHOLHDL 4.3 08/05/2012   Lab Results  Component Value Date   HGBA1C 5.6 09/20/2012   No results found for: ONGEXBMW41 Lab Results  Component Value Date   TSH 1.185 08/05/2012    11/13/06 MRI brain  1. No evidence of pituitary  microadenoma.  2. 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  3. 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  4. Cystic fluid-filled spaces in the temporal bone bilaterally are stable and may be related to large endolymphatic sac anomaly or possibly other benign process.     ASSESSMENT AND PLAN  25 y.o. year old male here with low IQ, developmental delay, hypogonadism, with progressive left foot deformity. 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 Patient does endorse some low back pain and radiating symptoms in the right leg. Will check MRI lumbar spine for further evaluation.  Ddx: progressive deformity due to congential brain syndrome vs lumbar radiculopathy   Dx:   Cavovarus deformity of foot - Plan: MR Lumbar Spine Wo  Contrast  Right-sided low back pain with right-sided sciatica - Plan: MR Lumbar Spine Wo Contrast  Organic brain syndrome (chronic)  Morbid obesity, unspecified obesity type (HCC)    PLAN:  Orders Placed This Encounter  Procedures  . MR Lumbar Spine Wo Contrast   Return if symptoms worsen or fail to improve.    Suanne Marker, MD 06/19/2015, 11:35 AM Certified in Neurology, Neurophysiology and Neuroimaging  [facility] [address] [place], [place] [address] [phone]

## 2015-06-19 NOTE — Patient Instructions (Signed)

## 2015-06-23 ENCOUNTER — Other Ambulatory Visit: Payer: PRIVATE HEALTH INSURANCE

## 2015-06-24 ENCOUNTER — Other Ambulatory Visit: Payer: PRIVATE HEALTH INSURANCE

## 2015-06-30 ENCOUNTER — Ambulatory Visit
Admission: RE | Admit: 2015-06-30 | Discharge: 2015-06-30 | Disposition: A | Discharge status: Home / Self Care | Payer: PRIVATE HEALTH INSURANCE | Source: Ambulatory Visit | Attending: Diagnostic Neuroimaging | Admitting: Diagnostic Neuroimaging

## 2015-06-30 DIAGNOSIS — M5441 Lumbago with sciatica, right side: Secondary | ICD-10-CM

## 2015-06-30 DIAGNOSIS — M2110 Varus deformity, not elsewhere classified, unspecified site: Secondary | ICD-10-CM | POA: Diagnosis not present

## 2015-06-30 DIAGNOSIS — Q661 Congenital talipes calcaneovarus, unspecified foot: Secondary | ICD-10-CM

## 2015-07-10 ENCOUNTER — Telehealth: Payer: Self-pay | Admitting: *Deleted

## 2015-07-10 NOTE — Telephone Encounter (Signed)
Spoke with patient and informed him, per Dr Danae Orleans his MRI showed mild changes but nothing that needs surgical intervention/treatment. Patient should follow up with foot/ankle specialist. Patient verbalized understanding, appreciation for call.

## 2016-07-01 DIAGNOSIS — J209 Acute bronchitis, unspecified: Secondary | ICD-10-CM | POA: Diagnosis not present

## 2017-02-17 ENCOUNTER — Ambulatory Visit
Admission: EM | Admit: 2017-02-17 | Discharge: 2017-02-17 | Disposition: A | Discharge status: Home / Self Care | Payer: PRIVATE HEALTH INSURANCE | Attending: Emergency Medicine | Admitting: Emergency Medicine

## 2017-02-17 ENCOUNTER — Encounter: Payer: Self-pay | Admitting: Emergency Medicine

## 2017-02-17 DIAGNOSIS — R0981 Nasal congestion: Secondary | ICD-10-CM

## 2017-02-17 DIAGNOSIS — R05 Cough: Secondary | ICD-10-CM

## 2017-02-17 DIAGNOSIS — J302 Other seasonal allergic rhinitis: Secondary | ICD-10-CM

## 2017-02-17 MED ORDER — ALBUTEROL SULFATE HFA 108 (90 BASE) MCG/ACT IN AERS: 1.0000 | INHALATION_SPRAY | Freq: Four times a day (QID) | RESPIRATORY_TRACT | 0 refills | Status: DC | PRN

## 2017-02-17 MED ORDER — FLUTICASONE PROPIONATE 50 MCG/ACT NA SUSP: 2.0000 | Freq: Every day | NASAL | 0 refills | Status: DC

## 2017-02-17 MED ORDER — AEROCHAMBER PLUS MISC: 2 refills | Status: DC

## 2017-02-17 NOTE — ED Triage Notes (Signed)
Patient c/o runny nose, congestion, HAs and cough that started 2 days.  Patient denies fevers.

## 2017-02-17 NOTE — Discharge Instructions (Signed)
Try an antihistamine/decongestant combination such as Claritin D, Allegra-D, or Zyrtec-D, but watch your blood pressure and to switch to plain Claritin, Allegra, or Zyrtec if your blood pressure starts creeping up. Also Flonase, saline nasal irrigation with a Lloyd Huger med sinus rinse or neti pot, 2 puffs from your albuterol inhaler with a spacer further wheezing and coughing, every 4-6 hours.

## 2017-02-17 NOTE — ED Provider Notes (Signed)
HPI  SUBJECTIVE:  Stephen Miles is a 26 y.o. male who presents with yellow nasal congestion, rhinorrhea, postnasal drip, cough productive of the same yellow material as his nasal congestion, wheezing, chest soreness after coughing. He reports allergy symptoms of watery eyes and sneezing. Questionable dyspnea on exertion. This has been going on for 2 days. He reports left-sided sinus pressure several days ago but this has since resolved. No fevers, ear pain. Had a sore throat yesterday but this has resolved. No upper dental pain. No shortness of breath. No chest pain. States that this happens every year around this time. States that he is sleeping okay at night. No Antipyretic in the past 6-8 hours. He tried an over-the-counter unknown flu and Medicine with some improvement in his symptoms. Symptoms are worse with going outside. Past medical history of hypertension, prediabetes. He is a former smoker quit in 2014. No history of allergies, sinusitis, asthma, emphysema, COPD. [id number], Excell Seltzer., MD     Past Medical History:  Diagnosis Date  . Acanthosis   . Brain cyst   . Cognitive deficits   . Dyspepsia   . GERD (gastroesophageal reflux disease)   . Goiter   . Gynecomastia, male   . Hyperlipidemia type II   . Hypertension   . Hypogonadism male   . Obesity (BMI 30-39.9)   . Organic brain syndrome (chronic)   . Prediabetes   . Puberty delay     Past Surgical History:  Procedure Laterality Date  . EYE MUSCLE SURGERY    . ORCHIOPEXY      Family History  Problem Relation Age of Onset  . Diabetes Mother   . Diabetes Sister   . Thyroid disease Paternal Grandfather   . Cancer Paternal Grandmother     Social History  Substance Use Topics  . Smoking status: Former Smoker    Years: 2.00    Types: Cigarettes  . Smokeless tobacco: Never Used     Comment: occasionally  . Alcohol use No    No current facility-administered medications for this encounter.   Current Outpatient  Prescriptions:  .  pioglitazone (ACTOS) 15 MG tablet, Take 15 mg by mouth daily., Disp: , Rfl:  .  albuterol (PROVENTIL HFA;VENTOLIN HFA) 108 (90 Base) MCG/ACT inhaler, Inhale 1-2 puffs into the lungs every 6 (six) hours as needed for wheezing or shortness of breath., Disp: 1 Inhaler, Rfl: 0 .  atorvastatin (LIPITOR) 10 MG tablet, Take 10 mg by mouth., Disp: , Rfl:  .  fluticasone (FLONASE) 50 MCG/ACT nasal spray, Place 2 sprays into both nostrils daily., Disp: 16 g, Rfl: 0 .  glipiZIDE (GLUCOTROL XL) 10 MG 24 hr tablet, Take 10 mg by mouth daily., Disp: , Rfl: 5 .  lisinopril (PRINIVIL,ZESTRIL) 5 MG tablet, Take 5 mg by mouth., Disp: , Rfl:  .  metFORMIN (GLUCOPHAGE) 1000 MG tablet, Take 1,000 mg by mouth., Disp: , Rfl:  .  omeprazole (PRILOSEC) 10 MG capsule, Take 20 mg by mouth., Disp: , Rfl:  .  Spacer/Aero-Holding Chambers (AEROCHAMBER PLUS) inhaler, Use as instructed, Disp: 1 each, Rfl: 2  No Known Allergies   ROS  As noted in HPI.   Physical Exam  BP (!) 153/68 (BP Location: Left Arm)   Pulse 80   Temp 98.5 F (36.9 C) (Oral)   Resp 16   Ht 6' (1.829 m)   Wt 280 lb (127 kg)   SpO2 100%   BMI 37.97 kg/m   Constitutional: Well developed, well nourished,  no acute distress Eyes:  EOMI, conjunctiva normal bilaterally HENT: Normocephalic, atraumatic,mucus membranes moist. Positive clear mucoid nasal congestion with erythematous, swollen turbinates. No sinus tenderness. Oropharynx normal. Positive postnasal drip. Respiratory: Normal inspiratory effort lungs clear bilaterally, no chest wall tenderness Cardiovascular: Normal rate regular rhythm no murmurs rubs or gallops GI: nondistended skin: No rash, skin intact Musculoskeletal: no deformities Neurologic: Alert & oriented x 3, no focal neuro deficits Psychiatric: Speech and behavior appropriate   ED Course   Medications - No data to display  No orders of the defined types were placed in this encounter.   No results  found for this or any previous visit (from the past 24 hour(s)). No results found.  ED Clinical Impression  Seasonal allergic rhinitis, unspecified trigger   ED Assessment/Plan  Presentation most consistent with seasonal allergies since this happens around this time every year. Will send home with an antihistamine/decongestant combination such as Claritin D, Allegra-D, or Zyrtec-D, advised patient to watch his blood pressure and to switch to plain Claritin, Allegra, or Zyrtec if his blood pressure starts creeping up. Also Flonase, saline nasal irrigation, an albuterol inhaler with a spacer for the wheezing and coughing. No evidence of bacterial sinus infection that would require antibiotics at this time. Follow up with PMD as needed.  Discussed MDM, plan and followup with patient. Patient agrees with plan.   Meds ordered this encounter  Medications  . pioglitazone (ACTOS) 15 MG tablet    Sig: Take 15 mg by mouth daily.  . fluticasone (FLONASE) 50 MCG/ACT nasal spray    Sig: Place 2 sprays into both nostrils daily.    Dispense:  16 g    Refill:  0  . albuterol (PROVENTIL HFA;VENTOLIN HFA) 108 (90 Base) MCG/ACT inhaler    Sig: Inhale 1-2 puffs into the lungs every 6 (six) hours as needed for wheezing or shortness of breath.    Dispense:  1 Inhaler    Refill:  0  . Spacer/Aero-Holding Chambers (AEROCHAMBER PLUS) inhaler    Sig: Use as instructed    Dispense:  1 each    Refill:  2    *This clinic note was created using Dragon dictation software. Therefore, there may be occasional mistakes despite careful proofreading.  ?    Domenick Gong, MD 02/17/17 1342

## 2022-05-12 ENCOUNTER — Other Ambulatory Visit: Payer: Self-pay

## 2022-05-12 NOTE — Progress Notes (Signed)
Pre employment urine drug screen completed and cleared for [facility].

## 2022-07-01 ENCOUNTER — Other Ambulatory Visit: Payer: Self-pay | Admitting: Physician Assistant

## 2022-07-01 ENCOUNTER — Encounter: Payer: Self-pay | Admitting: Physician Assistant

## 2022-07-01 VITALS — BP 160/103 | Temp 97.6°F | Resp 12 | Ht 72.0 in | Wt 250.0 lb

## 2022-07-01 DIAGNOSIS — R0989 Other specified symptoms and signs involving the circulatory and respiratory systems: Secondary | ICD-10-CM

## 2022-07-01 LAB — POCT INFLUENZA A/B
Influenza A, POC: NEGATIVE
Influenza B, POC: NEGATIVE

## 2022-07-01 MED ORDER — PROMETHAZINE-DM 6.25-15 MG/5ML PO SYRP: 5.0000 mL | ORAL_SOLUTION | Freq: Four times a day (QID) | ORAL | 0 refills | Status: DC | PRN

## 2022-07-01 NOTE — Progress Notes (Signed)
Pt presents today with chest congestion and drainage since 06/29/22.

## 2022-07-01 NOTE — Progress Notes (Signed)
   Subjective: Cough and congestion    Patient ID: Stephen Miles, male    DOB: 04/23/1991, 32 y.o.   MRN: [id number]  HPI Patient presents with cough congestion for 2 days.  Patient tested negative for COVID-19 and influenza.  Patient states cough is nonproductive.  Notes patient has not Had a physical evaluation by this clinic.  Review of Systems Diabetes, hyperlipidemia, and hypertension.    Objective:   Physical Exam  BP is 160/103, respiration 12, temperature 97.6, patient 99% O2 sat on room air.  Patient weighs 250 pounds BMI is 33.91.   HEENT is unremarkable.  Neck is supple lymphadenopathy or bruits.  Lungs are clear to auscultation.  Heart is regular rate and rhythm.      Assessment & Plan: Upper respiratory infection   Patient given a prescription for Phenergan DM.  Patient advised to schedule appointment for physical exam.

## 2022-07-21 ENCOUNTER — Ambulatory Visit: Payer: Self-pay

## 2022-07-21 DIAGNOSIS — R81 Glycosuria: Secondary | ICD-10-CM

## 2022-07-21 DIAGNOSIS — Z0000 Encounter for general adult medical examination without abnormal findings: Secondary | ICD-10-CM

## 2022-07-21 LAB — POCT URINALYSIS DIPSTICK
Bilirubin, UA: NEGATIVE
Blood, UA: POSITIVE
Glucose, UA: POSITIVE — AB
Ketones, UA: POSITIVE
Leukocytes, UA: NEGATIVE
Nitrite, UA: NEGATIVE
Protein, UA: POSITIVE — AB
Spec Grav, UA: >=1.03 — AB (ref 1.010–1.025)
Urobilinogen, UA: 0.2 E.U./dL
pH, UA: 5 (ref 5.0–8.0)

## 2022-07-21 NOTE — Progress Notes (Signed)
Pt presents today to complete lap portion of physical./CL,RMA

## 2022-07-22 LAB — CMP12+LP+TP+TSH+6AC+CBC/D/PLT
ALT: 26 IU/L (ref 0–44)
AST: 13 IU/L (ref 0–40)
Albumin/Globulin Ratio: 1.5 (ref 1.2–2.2)
Albumin: 4.9 g/dL (ref 4.1–5.1)
Alkaline Phosphatase: 90 IU/L (ref 44–121)
BUN/Creatinine Ratio: 12 (ref 9–20)
BUN: 8 mg/dL (ref 6–20)
Basophils Absolute: 0 10*3/uL (ref 0.0–0.2)
Basos: 1 %
Bilirubin Total: 0.2 mg/dL (ref 0.0–1.2)
Calcium: 9.8 mg/dL (ref 8.7–10.2)
Chloride: 97 mmol/L (ref 96–106)
Chol/HDL Ratio: 5.6 ratio — ABNORMAL HIGH (ref 0.0–5.0)
Cholesterol, Total: 222 mg/dL — ABNORMAL HIGH (ref 100–199)
Creatinine, Ser: 0.69 mg/dL — ABNORMAL LOW (ref 0.76–1.27)
EOS (ABSOLUTE): 0.2 10*3/uL (ref 0.0–0.4)
Eos: 2 %
Estimated CHD Risk: 1.2 times avg. — ABNORMAL HIGH (ref 0.0–1.0)
Free Thyroxine Index: 2.3 (ref 1.2–4.9)
GGT: 44 IU/L (ref 0–65)
Globulin, Total: 3.2 g/dL (ref 1.5–4.5)
Glucose: 364 mg/dL — ABNORMAL HIGH (ref 70–99)
HDL: 40 mg/dL (ref >39)
Hematocrit: 47 % (ref 37.5–51.0)
Hemoglobin: 15.7 g/dL (ref 13.0–17.7)
Immature Grans (Abs): 0 10*3/uL (ref 0.0–0.1)
Immature Granulocytes: 0 %
Iron: 80 ug/dL (ref 38–169)
LDH: 221 IU/L (ref 121–224)
LDL Chol Calc (NIH): 124 mg/dL — ABNORMAL HIGH (ref 0–99)
Lymphocytes Absolute: 2.5 10*3/uL (ref 0.7–3.1)
Lymphs: 30 %
MCH: 28.9 pg (ref 26.6–33.0)
MCHC: 33.4 g/dL (ref 31.5–35.7)
MCV: 87 fL (ref 79–97)
Monocytes Absolute: 0.5 10*3/uL (ref 0.1–0.9)
Monocytes: 6 %
Neutrophils Absolute: 5 10*3/uL (ref 1.4–7.0)
Neutrophils: 61 %
Phosphorus: 3.3 mg/dL (ref 2.8–4.1)
Platelets: 245 10*3/uL (ref 150–450)
Potassium: 4.2 mmol/L (ref 3.5–5.2)
RBC: 5.43 x10E6/uL (ref 4.14–5.80)
RDW: 12.8 % (ref 11.6–15.4)
Sodium: 137 mmol/L (ref 134–144)
T3 Uptake Ratio: 27 % (ref 24–39)
T4, Total: 8.5 ug/dL (ref 4.5–12.0)
TSH: 2.46 u[IU]/mL (ref 0.450–4.500)
Total Protein: 8.1 g/dL (ref 6.0–8.5)
Triglycerides: 331 mg/dL — ABNORMAL HIGH (ref 0–149)
Uric Acid: 2.9 mg/dL — ABNORMAL LOW (ref 3.8–8.4)
VLDL Cholesterol Cal: 58 mg/dL — ABNORMAL HIGH (ref 5–40)
WBC: 8.2 10*3/uL (ref 3.4–10.8)
eGFR: 127 mL/min/{1.73_m2} (ref >59)

## 2022-07-22 LAB — HGB A1C W/O EAG: Hgb A1c MFr Bld: 11.2 % — ABNORMAL HIGH (ref 4.8–5.6)

## 2022-07-28 ENCOUNTER — Ambulatory Visit: Payer: Self-pay | Admitting: Physician Assistant

## 2022-07-28 ENCOUNTER — Encounter: Payer: Self-pay | Admitting: Physician Assistant

## 2022-07-28 VITALS — BP 137/87 | HR 92 | Temp 97.6°F | Resp 14 | Ht 72.0 in | Wt 235.0 lb

## 2022-07-28 DIAGNOSIS — E7801 Familial hypercholesterolemia: Secondary | ICD-10-CM

## 2022-07-28 DIAGNOSIS — Z0000 Encounter for general adult medical examination without abnormal findings: Secondary | ICD-10-CM

## 2022-07-28 DIAGNOSIS — E0800 Diabetes mellitus due to underlying condition with hyperosmolarity without nonketotic hyperglycemic-hyperosmolar coma (NKHHC): Secondary | ICD-10-CM

## 2022-07-28 MED ORDER — ROSUVASTATIN CALCIUM 40 MG PO TABS: 40.0000 mg | ORAL_TABLET | Freq: Every day | ORAL | 3 refills | Status: DC

## 2022-07-28 MED ORDER — LANCET DEVICE MISC: 1.0000 | Freq: Three times a day (TID) | 0 refills | Status: AC

## 2022-07-28 MED ORDER — BLOOD GLUCOSE TEST VI STRP: 1.0000 | ORAL_STRIP | Freq: Three times a day (TID) | 0 refills | Status: AC

## 2022-07-28 MED ORDER — METFORMIN HCL 1000 MG PO TABS: 1000.0000 mg | ORAL_TABLET | Freq: Two times a day (BID) | ORAL | 3 refills | Status: AC

## 2022-07-28 MED ORDER — LANCETS MISC. MISC: 1.0000 | Freq: Three times a day (TID) | 0 refills | Status: AC

## 2022-07-28 MED ORDER — BLOOD GLUCOSE MONITORING SUPPL DEVI: 1.0000 | Freq: Three times a day (TID) | 0 refills | Status: AC

## 2022-07-28 NOTE — Progress Notes (Signed)
Pt presents today to complete physical, Pt has no concerns or issues but it aware of needing medication management for his diabetes.

## 2022-07-28 NOTE — Progress Notes (Signed)
[facility] Emergency Department Provider Note  ____________________________________________   None    (approximate)  I have reviewed the triage vital signs and the nursing notes.   HISTORY  Chief Complaint No chief complaint on file.   HPI Stephen Miles is a 32 y.o. male patient presents for annual physical exam.  Patient has a history of diabetes and hyperlipidemia.  Patient has not taken medication since 2017.  States noncompliance is secondary to finances.         Past Medical History:  Diagnosis Date   Acanthosis    Brain cyst    Cognitive deficits    Dyspepsia    GERD (gastroesophageal reflux disease)    Goiter    Gynecomastia, male    Hyperlipidemia type II    Hypertension    Hypogonadism male    Obesity (BMI 30-39.9)    Organic brain syndrome (chronic)    Prediabetes    Puberty delay     Patient Active Problem List   Diagnosis Date Noted   OSA (obstructive sleep apnea) 02/02/2013   Obesity, morbid ([place]) 09/20/2012   Goiter    Obesity (BMI 30-39.9)    Acanthosis    Hypogonadism male    Dyspepsia    Hyperlipidemia type II    Prediabetes    Brain cyst    GERD (gastroesophageal reflux disease)    Gynecomastia, male    Hypertension    Organic brain syndrome (chronic)    Pre-diabetes 11/11/2010   Essential hypertension, benign 11/11/2010   Obesity 11/11/2010   Goiter, unspecified 11/11/2010    Past Surgical History:  Procedure Laterality Date   EYE MUSCLE SURGERY     ORCHIOPEXY      Prior to Admission medications   Medication Sig Start Date End Date Taking? Authorizing Provider  albuterol (PROVENTIL HFA;VENTOLIN HFA) 108 (90 Base) MCG/ACT inhaler Inhale 1-2 puffs into the lungs every 6 (six) hours as needed for wheezing or shortness of breath. Patient not taking: Reported on 07/01/2022 02/17/17   Melynda Ripple, MD  atorvastatin (LIPITOR) 10 MG tablet Take 10 mg by mouth. Patient not taking: Reported  on 07/01/2022    [provider]  fluticasone (FLONASE) 50 MCG/ACT nasal spray Place 2 sprays into both nostrils daily. Patient not taking: Reported on 07/01/2022 02/17/17   Melynda Ripple, MD  glipiZIDE (GLUCOTROL XL) 10 MG 24 hr tablet Take 10 mg by mouth daily. Patient not taking: Reported on 07/01/2022 06/06/15   [provider]  lisinopril (PRINIVIL,ZESTRIL) 5 MG tablet Take 5 mg by mouth. Patient not taking: Reported on 07/01/2022    [provider]  metFORMIN (GLUCOPHAGE) 1000 MG tablet Take 1,000 mg by mouth. Patient not taking: Reported on 07/01/2022    [provider]  omeprazole (PRILOSEC) 10 MG capsule Take 20 mg by mouth. Patient not taking: Reported on 07/01/2022    [provider]  pioglitazone (ACTOS) 15 MG tablet Take 15 mg by mouth daily. Patient not taking: Reported on 07/01/2022    [provider]  promethazine-dextromethorphan (PROMETHAZINE-DM) 6.25-15 MG/5ML syrup Take 5 mLs by mouth 4 (four) times daily as needed for cough. 07/01/22   Sable Feil, PA-C  Spacer/Aero-Holding Chambers (AEROCHAMBER PLUS) inhaler Use as instructed Patient not taking: Reported on 07/01/2022 02/17/17   Melynda Ripple, MD    Allergies Patient has no known allergies.  Family History  Problem Relation Age of Onset   Diabetes Mother    Diabetes Sister  Thyroid disease Paternal Grandfather    Cancer Paternal Grandmother     Social History Social History   Tobacco Use   Smoking status: Former    Years: 2.00    Types: Cigarettes   Smokeless tobacco: Never   Tobacco comments:    occasionally  Substance Use Topics   Alcohol use: No   Drug use: No    Review of Systems   Constitutional: No fever/chills Eyes: No visual changes. ENT: No sore throat. Cardiovascular: Denies chest pain. Respiratory: Denies shortness of breath. Gastrointestinal: No abdominal pain.  No nausea, no vomiting.  No diarrhea.  No constipation. Genitourinary:  Negative for dysuria. Musculoskeletal: Negative for back pain. Skin: Negative for rash. Neurological: Negative for headaches, focal weakness or numbness.  ____________________________________________   PHYSICAL EXAM:  VITAL SIGNS: BP is 137/87, pulse 92, respiration 14, temperature 97.6, and patient 99% O2 sat on room air.  Patient weighs 235 pounds and BMI is 31.87. Constitutional: Alert and oriented. Well appearing and in no acute distress. Eyes: Conjunctivae are normal. PERRL. EOMI. Head: Atraumatic. Nose: No congestion/rhinnorhea. Mouth/Throat: Mucous membranes are moist.  Oropharynx non-erythematous. Neck: No stridor. No cervical spine tenderness to palpation. Hematological/Lymphatic/Immunilogical: No cervical lymphadenopathy. Cardiovascular: Normal rate, regular rhythm. Grossly normal heart sounds.  Good peripheral circulation. Respiratory: Normal respiratory effort.  No retractions. Lungs CTAB. Gastrointestinal: Soft and nontender. No distention. No abdominal bruits. No CVA tenderness. Genitourinary: Deferred Musculoskeletal: No lower extremity tenderness nor edema.  No joint effusions. Neurologic:  Normal speech and language. No gross focal neurologic deficits are appreciated. No gait instability. Skin:  Skin is warm, dry and intact. No rash noted. Psychiatric: Mood and affect are normal. Speech and behavior are normal.  ____________________________________________   LABS      Component Ref Range & Units 7 d ago  Color, UA  Yellow  Clarity, UA  Clear  Glucose, UA Negative Positive Abnormal   Comment: 3+  Bilirubin, UA  Negative  Ketones, UA  Positive  Comment: 1+  Spec Grav, UA 1.010 - 1.025 >=1.030 Abnormal   Blood, UA  Positive  Comment: 1+  pH, UA 5.0 - 8.0 5.0  Protein, UA Negative Positive Abnormal   Urobilinogen, UA 0.2 or 1.0 E.U./dL 0.2  Nitrite, UA  Negative  Leukocytes, UA Negative Negative  Appearance    Odor                   Component Ref  Range & Units 7 d ago (07/21/22) 9 yr ago (08/05/12) 9 yr ago (08/05/12) 9 yr ago (08/05/12) 11 yr ago (03/26/11) 11 yr ago (03/26/11)  Glucose 70 - 99 mg/dL 364 High    87    Uric Acid 3.8 - 8.4 mg/dL 2.9 Low        Comment:            Therapeutic target for gout patients: <6.0  BUN 6 - 20 mg/dL 8   10 R    Creatinine, Ser 0.76 - 1.27 mg/dL 0.69 Low    0.63 R    eGFR >59 mL/min/1.73 127       BUN/Creatinine Ratio 9 - 20 12       Sodium 134 - 144 mmol/L 137   138 R    Potassium 3.5 - 5.2 mmol/L 4.2   3.8 R    Chloride 96 - 106 mmol/L 97   101 R    Calcium 8.7 - 10.2 mg/dL 9.8   9.7 R    Phosphorus  2.8 - 4.1 mg/dL 3.3       Total Protein 6.0 - 8.5 g/dL 8.1   7.3 R    Albumin 4.1 - 5.1 g/dL 4.9   4.5 R    Globulin, Total 1.5 - 4.5 g/dL 3.2       Albumin/Globulin Ratio 1.2 - 2.2 1.5       Bilirubin Total 0.0 - 1.2 mg/dL 0.2   0.3 R    Alkaline Phosphatase 44 - 121 IU/L 90   63 R    LDH 121 - 224 IU/L 221       AST 0 - 40 IU/L 13   21 R    ALT 0 - 44 IU/L 26   26 R    GGT 0 - 65 IU/L 44       Iron 38 - 169 ug/dL 80       Cholesterol, Total 100 - 199 mg/dL 222 High        Triglycerides 0 - 149 mg/dL 331 High   137 R  220 High  R   HDL >39 mg/dL 40  36 Low   31 Low    VLDL Cholesterol Cal 5 - 40 mg/dL 58 High        LDL Chol Calc (NIH) 0 - 99 mg/dL 124 High        Chol/HDL Ratio 0.0 - 5.0 ratio 5.6 High   4.3 R  5.4 R   Comment:                                   T. Chol/HDL Ratio                                             Men  Women                               1/2 Avg.Risk  3.4    3.3                                   Avg.Risk  5.0    4.4                                2X Avg.Risk  9.6    7.1                                3X Avg.Risk 23.4   11.0  Estimated CHD Risk 0.0 - 1.0 times avg. 1.2 High        Comment: The CHD Risk is based on the T. Chol/HDL ratio. Other factors affect CHD Risk such as hypertension, smoking, diabetes, severe obesity, and family history of premature  CHD.  TSH 0.450 - 4.500 uIU/mL 2.460 1.185 R    1.804 R  T4, Total 4.5 - 12.0 ug/dL 8.5       T3 Uptake Ratio 24 - 39 % 27       Free Thyroxine Index 1.2 - 4.9 2.3       WBC 3.4 - 10.8 x10E3/uL 8.2  RBC 4.14 - 5.80 x10E6/uL 5.43       Hemoglobin 13.0 - 17.7 g/dL 15.7       Hematocrit 37.5 - 51.0 % 47.0       MCV 79 - 97 fL 87       MCH 26.6 - 33.0 pg 28.9       MCHC 31.5 - 35.7 g/dL 33.4       RDW 11.6 - 15.4 % 12.8       Platelets 150 - 450 x10E3/uL 245       Neutrophils Not Estab. % 61       Lymphs Not Estab. % 30       Monocytes Not Estab. % 6       Eos Not Estab. % 2       Basos Not Estab. % 1       Neutrophils Absolute 1.4 - 7.0 x10E3/uL 5.0       Lymphocytes Absolute 0.7 - 3.1 x10E3/uL 2.5       Monocytes Absolute 0.1 - 0.9 x10E3/uL 0.5       EOS (ABSOLUTE) 0.0 - 0.4 x10E3/uL 0.2       Basophils Absolute 0.0 - 0.2 x10E3/uL 0.0       Immature Granulocytes Not Estab. % 0                    ____________________________________________    ____________________________________________   INITIAL IMPRESSION / ASSESSMENT AND PLAN  As part of my medical decision making, I reviewed the following data within the [facility]      Discussed lab and physical exam with patient.  Patient restart metformin at 1000 mg twice daily and Crestor 40 mg daily.  Patient will follow-up in 3 months to repeat hemoglobin A1c, fasting glucose, and lipid profile.       ____________________________________________   FINAL CLINICAL IMPRESSION(S) / ED DIAGNOSES  '@[facility]'$ @   ED Discharge Orders     None        Note:  This document was prepared using Dragon voice recognition software and may include unintentional dictation errors.

## 2022-09-30 ENCOUNTER — Ambulatory Visit: Payer: Self-pay | Admitting: Physician Assistant

## 2022-09-30 ENCOUNTER — Encounter: Payer: Self-pay | Admitting: Physician Assistant

## 2022-09-30 VITALS — BP 145/95 | HR 96 | Temp 98.4°F | Resp 14

## 2022-09-30 DIAGNOSIS — L03811 Cellulitis of head [any part, except face]: Secondary | ICD-10-CM

## 2022-09-30 MED ORDER — DOXYCYCLINE MONOHYDRATE 100 MG PO CAPS
100.0000 mg | ORAL_CAPSULE | Freq: Two times a day (BID) | ORAL | 0 refills | Status: DC
Start: 2022-09-30 — End: 2023-01-15

## 2022-09-30 MED ORDER — IBUPROFEN 800 MG PO TABS: 800.0000 mg | ORAL_TABLET | Freq: Three times a day (TID) | ORAL | 0 refills | Status: DC | PRN

## 2022-09-30 NOTE — Progress Notes (Signed)
Pt presents today for cyst on back of neck.

## 2022-09-30 NOTE — Progress Notes (Signed)
   Subjective: Nodule lesion posterior inferior scalp    Patient ID: Stephen Miles, male    DOB: 1990/09/04, 32 y.o.   MRN: [id number]  HPI Patient complain of a painful nodule lesion posterior inferior scalp area for 5 days.  Patient states has been trying to express material from lesion with no success.  Patient stated waking today for palpable posterior right lateral cervical lesion.  Denies fever or chills associated with complaint.  No drainage.   Review of Systems GERD, goiter, hypertension, obesity, and prediabetes.    Objective:   Physical Exam BP 145/95  Pulse 96  Resp 14  Temp 98.4 F (36.9 C)  Temp src Oral  SpO2 98 %  Patient has palpable nodule lesion on erythematous base posterior inferior scalp area.  Cervical right lateral lymphadenectomy is appreciated.       Assessment & Plan: Cellulitis  Patient given prescription for doxycycline and ibuprofen.  Advised to apply warm compresses to area.  Patient advised to follow-up in 1 week for reevaluation.  Sooner if condition worsens.

## 2022-10-07 ENCOUNTER — Ambulatory Visit: Payer: Self-pay | Admitting: Physician Assistant

## 2022-10-07 ENCOUNTER — Encounter: Payer: Self-pay | Admitting: Physician Assistant

## 2022-10-07 VITALS — BP 115/68 | HR 77 | Temp 97.3°F | Resp 14

## 2022-10-07 DIAGNOSIS — L03811 Cellulitis of head [any part, except face]: Secondary | ICD-10-CM

## 2022-10-07 NOTE — Progress Notes (Signed)
Follow up for infected hair follicle area on posterior right neck.  Stated still on the Doxycycline as ordered and area appears with small bump and mild red/pink surrounding.  Stated still using the Motrin with effective pain relief stating, "it's 1000 times better".

## 2022-10-07 NOTE — Progress Notes (Signed)
   Subjective: Cellulitis    Patient ID: Stephen Miles, male    DOB: Dec 16, 1990, 32 y.o.   MRN: [id number]  HPI Patient follow-up 1 week secondary to being diagnosed with cellulitis to the right inferior posterior neck.  Patient placed on doxycycline and advised to apply warm compresses to the area.  Patient states that there was no drainage and lesion is almost completely resolved.   Review of Systems Negative septa above complaint    Objective:   Physical Exam BP 115/68  Pulse 77  Resp 14  Temp 97.3 F (36.3 C)  SpO2 97 %  Nodule lesion as described above is almost completely resolved.  Nontender to palpation.  No edema or erythema noticed.  No drainage.       Assessment & Plan: Cellulitis   Patient advised to finish antibiotics as directed.  Return back if condition worsens.

## 2022-10-28 ENCOUNTER — Other Ambulatory Visit: Payer: Self-pay

## 2023-01-15 ENCOUNTER — Ambulatory Visit: Payer: Self-pay | Admitting: Physician Assistant

## 2023-01-15 ENCOUNTER — Encounter: Payer: Self-pay | Admitting: Physician Assistant

## 2023-01-15 ENCOUNTER — Other Ambulatory Visit: Payer: Self-pay | Admitting: Physician Assistant

## 2023-01-15 VITALS — BP 151/99 | HR 82 | Temp 98.7°F | Resp 12 | Ht 72.0 in | Wt 244.0 lb

## 2023-01-15 DIAGNOSIS — J209 Acute bronchitis, unspecified: Secondary | ICD-10-CM

## 2023-01-15 MED ORDER — LISINOPRIL 5 MG PO TABS: 5.0000 mg | ORAL_TABLET | Freq: Every day | ORAL | 3 refills | Status: DC

## 2023-01-15 MED ORDER — AZITHROMYCIN 250 MG PO TABS: ORAL_TABLET | ORAL | 0 refills | Status: AC

## 2023-01-15 MED ORDER — ALBUTEROL SULFATE HFA 108 (90 BASE) MCG/ACT IN AERS: 1.0000 | INHALATION_SPRAY | Freq: Four times a day (QID) | RESPIRATORY_TRACT | 3 refills | Status: AC | PRN

## 2023-01-15 MED ORDER — LISINOPRIL 5 MG PO TABS: 5.0000 mg | ORAL_TABLET | Freq: Every day | ORAL | 3 refills | Status: AC

## 2023-01-15 MED ORDER — METHYLPREDNISOLONE 4 MG PO TBPK: ORAL_TABLET | ORAL | 0 refills | Status: DC

## 2023-01-15 NOTE — Progress Notes (Signed)
   Subjective: Wheezing and cough    Patient ID: MARKAI ORNDOFF, male    DOB: 1991-03-07, 32 y.o.   MRN: [id number]  HPI Patient complain of "rattles" with deep breathing.  Patient also has a nonproductive cough.  Patient has a history of asthma but is not using inhaler in several months.  He was also noted patient blood pressure is elevated he states not taking lisinopril 3 to 4 months.  The noncompliance of his medications secondary to changed PCP.   Review of Systems GERD, gout, hypertension, hypogonadism, and obstructive sleep apnea.    Objective:   Physical Exam BP 151/99  BP Location Left Arm  Patient Position Sitting  Cuff Size Large  Pulse 82  Resp 12  Temp 98.7 F (37.1 C)  Temp src Temporal  SpO2 97 %  Weight 244 lb (110.7 kg)  Height 6' (1.829 m)   BMI 33.09 kg/m2  BSA 2.37 m2  HEENT is unremarkable.  Neck is supple lymphadenopathy or bruits.  Lungs have bilateral upper lobe Rales.  Heart is regular rate and rhythm.       Assessment & Plan: Bronchospasm and hypertension  Patient given prescription for albuterol inhaler,Medrol Dosepak, Z-Pak, and lisinopril.  Patient will follow-up in 1 week for reevaluation.

## 2023-01-15 NOTE — Progress Notes (Signed)
Started hearing a rattle yesterday when he got off of work & feel something in my chest. Dry cough Denies headache, facial pain/pressure, ear discomfort, teeth/jaw discomfort, runny nose, watery eyes  OTC Dayquil  States when he was younger he had bronchitis & asthma Asthma is activity/exertional induced - in the past he had an inhaler  Not using Albuterol inhaler because he doesn't have one  AMD

## 2023-01-22 ENCOUNTER — Other Ambulatory Visit: Payer: Self-pay

## 2023-01-22 ENCOUNTER — Encounter: Payer: Self-pay | Admitting: Physician Assistant

## 2023-01-22 ENCOUNTER — Ambulatory Visit: Payer: Self-pay | Admitting: Physician Assistant

## 2023-01-22 VITALS — BP 158/96 | HR 89 | Temp 97.3°F | Resp 16

## 2023-01-22 DIAGNOSIS — E7801 Familial hypercholesterolemia: Secondary | ICD-10-CM

## 2023-01-22 DIAGNOSIS — J9801 Acute bronchospasm: Secondary | ICD-10-CM

## 2023-01-22 MED ORDER — ROSUVASTATIN CALCIUM 40 MG PO TABS: 40.0000 mg | ORAL_TABLET | Freq: Every day | ORAL | 3 refills | Status: DC

## 2023-01-22 MED ORDER — IPRATROPIUM-ALBUTEROL 0.5-2.5 (3) MG/3ML IN SOLN
3.0000 mL | Freq: Four times a day (QID) | RESPIRATORY_TRACT | Status: AC | PRN
Start: 2023-01-22 — End: 2023-01-22
  Administered 2023-01-22: 3 mL via RESPIRATORY_TRACT

## 2023-01-22 MED ORDER — AZITHROMYCIN 250 MG PO TABS
ORAL_TABLET | ORAL | 0 refills | Status: DC
Start: 2023-01-22 — End: 2023-01-27

## 2023-01-22 NOTE — Progress Notes (Signed)
Follow up with recent med change.  Denies any complaints.  Stated he doesn't check B/P at home.  Stated US his PRN inhaler was used several times this week.  Denies dyspnea with exertion at times and stated small amount expectorant with cough this week.  BP rechecked manually and stated he had a lot of caffeine today.

## 2023-01-22 NOTE — Addendum Note (Signed)
Addended by: Hansel Feinstein on: 01/22/2023 02:59 PM   Modules accepted: Orders

## 2023-01-22 NOTE — Progress Notes (Signed)
   Subjective: Cough and wheezing    Patient ID: Stephen Miles, male    DOB: Nov 28, 1990, 32 y.o.   MRN: [id number]  HPI Patient is 1 week follow-up for cough secondary bronchospasms.  Patient was given an inhaler.  It was noted the patient blood pressure was elevated and he was also given a prescription for lisinopril at 5 mg.  Patient presents today with decreased cough and still mild wheezing.  There has been a slight decrease of patient blood pressure from last exam.   Review of Systems .,  Hypertension, and obstructive sleep apnea.    Objective:   Physical Exam BP 158/96  Pulse 89  Resp 16  Temp 97.3 F (36.3 C)  SpO2 99 %  No acute distress.  HEENT is unremarkable.  Neck is supple followed lymphadenopathy or bruits.  Patient has inspiratory wheezing mostly on the left side.  Heart is regular rate and rhythm.       Assessment & Plan: Hypertension and reactive airway  Patient given DuoNeb treatment in clinic.  Status post DuoNeb patient have decreased wheezing respiration.  Advised to increase lisinopril from 5 mg to 10 mg.  Follow-up next week for 3-day blood pressure check.

## 2023-01-27 ENCOUNTER — Ambulatory Visit: Payer: Self-pay

## 2023-01-27 DIAGNOSIS — Z23 Encounter for immunization: Secondary | ICD-10-CM

## 2023-01-27 NOTE — Progress Notes (Signed)
Pt presents today for 1st BP check. Final reading 130/86. Pt also receiving Tdap.

## 2023-01-28 ENCOUNTER — Ambulatory Visit: Payer: Self-pay

## 2023-01-28 NOTE — Progress Notes (Signed)
Pt presents today for 2nd BP check. 133/79 P.79

## 2023-01-29 ENCOUNTER — Ambulatory Visit: Payer: Self-pay

## 2023-01-29 NOTE — Progress Notes (Signed)
Denies any complaints.  Returned for B/P check.

## 2023-04-02 ENCOUNTER — Encounter: Payer: Self-pay | Admitting: Physician Assistant

## 2023-04-02 ENCOUNTER — Ambulatory Visit: Payer: Self-pay | Admitting: Physician Assistant

## 2023-04-02 VITALS — BP 135/85 | HR 110 | Temp 97.1°F | Resp 14 | Ht 72.0 in | Wt 243.0 lb

## 2023-04-02 DIAGNOSIS — E0800 Diabetes mellitus due to underlying condition with hyperosmolarity without nonketotic hyperglycemic-hyperosmolar coma (NKHHC): Secondary | ICD-10-CM

## 2023-04-02 LAB — POCT GLYCOSYLATED HEMOGLOBIN (HGB A1C): Hemoglobin A1C: 10.2 % — AB (ref 4.0–5.6)

## 2023-04-02 MED ORDER — PIOGLITAZONE HCL 15 MG PO TABS: 15.0000 mg | ORAL_TABLET | Freq: Every day | ORAL | 3 refills | Status: AC

## 2023-04-02 MED ORDER — FREESTYLE LIBRE 3 PLUS SENSOR MISC: 11 refills | Status: AC

## 2023-04-02 NOTE — Progress Notes (Signed)
States he feels like glucose is running low - "cringing gut feeling - like weird" & clammy.  Ate lunch about 30 minutes ago & checked BS 15 minutes later & reading was 411. Ate fried chicken sandwich, french fries & water  Denies vision problems, headache, SOB/breathing issues, heart issues (racing HR or palpitations)  Checks BS 1-2x per day Fasting BS 180-200  States feels OK at present  AMD

## 2023-04-02 NOTE — Progress Notes (Signed)
   Subjective: Diabetes    Patient ID: Stephen Miles, male    DOB: 1990/10/13, 32 y.o.   MRN: [id number]  HPI Patient is a type II diabetic reporting a glucose of 411 status post lunch today.  Patient stated took his levels approxi-15 minutes after eating.  Meal consists of fast food combo.  Patient has a history of elevated hemoglobin A1c.  Patient was last evaluated on February 2024.  Hemoglobin A1c level was 11.2.  Patient does not follow-up as directed.  Patient also requesting continuous glucose monitoring device.  Patient was started on metformin 1000 mg twice daily.  Patient does not follow-up as directed.   Review of Systems Diabetes and hyperlipidemia    Objective:   Physical Exam BP 135/85  BP Location Left Arm  Patient Position Sitting  Cuff Size Large  Pulse 110  Resp 14  Temp 97.1 F (36.2 C)  Temp src Temporal  SpO2 98 %  Weight 243 lb (110.2 kg)  Height 6' (1.829 m)   BMI 32.96 kg/m2  BSA 2.37 m2  Hemoglobin A1c today was 10.1.       Assessment & Plan: Type 2 diabetes   Patient diabetes is not well-controlled.  Patient is amenable to the addition of Actos at 15 mg along with metformin 1000 mg twice daily.  Patient also given a prescription continuous glucose monitoring.  Patient will follow-up in 1 month.

## 2023-04-15 ENCOUNTER — Other Ambulatory Visit: Payer: Self-pay | Admitting: Physician Assistant

## 2023-05-07 ENCOUNTER — Ambulatory Visit: Payer: Self-pay | Admitting: Physician Assistant

## 2023-08-21 ENCOUNTER — Ambulatory Visit: Payer: Self-pay

## 2023-08-21 DIAGNOSIS — E0800 Diabetes mellitus due to underlying condition with hyperosmolarity without nonketotic hyperglycemic-hyperosmolar coma (NKHHC): Secondary | ICD-10-CM

## 2023-08-21 DIAGNOSIS — Z0000 Encounter for general adult medical examination without abnormal findings: Secondary | ICD-10-CM

## 2023-08-21 LAB — POCT URINALYSIS DIPSTICK
Bilirubin, UA: POSITIVE
Blood, UA: POSITIVE
Glucose, UA: POSITIVE — AB
Ketones, UA: NEGATIVE
Leukocytes, UA: NEGATIVE
Nitrite, UA: NEGATIVE
Protein, UA: POSITIVE — AB
Spec Grav, UA: >=1.03 — AB (ref 1.010–1.025)
Urobilinogen, UA: 0.2 U/dL
pH, UA: 5 (ref 5.0–8.0)

## 2023-08-21 NOTE — Progress Notes (Signed)
 Pt completed labs for physical. [name]

## 2023-08-22 LAB — CMP12+LP+TP+TSH+6AC+CBC/D/PLT
ALT: 26 IU/L (ref 0–44)
AST: 16 IU/L (ref 0–40)
Albumin: 4.5 g/dL (ref 4.1–5.1)
Alkaline Phosphatase: 69 IU/L (ref 44–121)
BUN/Creatinine Ratio: 25 — ABNORMAL HIGH (ref 9–20)
BUN: 14 mg/dL (ref 6–20)
Basophils Absolute: 0 10*3/uL (ref 0.0–0.2)
Basos: 1 %
Bilirubin Total: 0.3 mg/dL (ref 0.0–1.2)
Calcium: 9.5 mg/dL (ref 8.7–10.2)
Chloride: 98 mmol/L (ref 96–106)
Chol/HDL Ratio: 5 ratio (ref 0.0–5.0)
Cholesterol, Total: 170 mg/dL (ref 100–199)
Creatinine, Ser: 0.56 mg/dL — ABNORMAL LOW (ref 0.76–1.27)
EOS (ABSOLUTE): 0.1 10*3/uL (ref 0.0–0.4)
Eos: 2 %
Estimated CHD Risk: 1 times avg. (ref 0.0–1.0)
Free Thyroxine Index: 1.9 (ref 1.2–4.9)
GGT: 43 IU/L (ref 0–65)
Globulin, Total: 3 g/dL (ref 1.5–4.5)
Glucose: 255 mg/dL — ABNORMAL HIGH (ref 70–99)
HDL: 34 mg/dL — ABNORMAL LOW (ref >39)
Hematocrit: 41.5 % (ref 37.5–51.0)
Hemoglobin: 14.1 g/dL (ref 13.0–17.7)
Immature Grans (Abs): 0 10*3/uL (ref 0.0–0.1)
Immature Granulocytes: 0 %
Iron: 85 ug/dL (ref 38–169)
LDH: 198 IU/L (ref 121–224)
LDL Chol Calc (NIH): 90 mg/dL (ref 0–99)
Lymphocytes Absolute: 2.2 10*3/uL (ref 0.7–3.1)
Lymphs: 35 %
MCH: 29.3 pg (ref 26.6–33.0)
MCHC: 34 g/dL (ref 31.5–35.7)
MCV: 86 fL (ref 79–97)
Monocytes Absolute: 0.5 10*3/uL (ref 0.1–0.9)
Monocytes: 7 %
Neutrophils Absolute: 3.6 10*3/uL (ref 1.4–7.0)
Neutrophils: 55 %
Phosphorus: 3.7 mg/dL (ref 2.8–4.1)
Platelets: 220 10*3/uL (ref 150–450)
Potassium: 4 mmol/L (ref 3.5–5.2)
RBC: 4.82 x10E6/uL (ref 4.14–5.80)
RDW: 12.8 % (ref 11.6–15.4)
Sodium: 137 mmol/L (ref 134–144)
T3 Uptake Ratio: 25 % (ref 24–39)
T4, Total: 7.6 ug/dL (ref 4.5–12.0)
TSH: 2 u[IU]/mL (ref 0.450–4.500)
Total Protein: 7.5 g/dL (ref 6.0–8.5)
Triglycerides: 277 mg/dL — ABNORMAL HIGH (ref 0–149)
Uric Acid: 3.8 mg/dL (ref 3.8–8.4)
VLDL Cholesterol Cal: 46 mg/dL — ABNORMAL HIGH (ref 5–40)
WBC: 6.4 10*3/uL (ref 3.4–10.8)
eGFR: 134 mL/min/{1.73_m2} (ref >59)

## 2023-08-22 LAB — MICROALBUMIN / CREATININE URINE RATIO
Creatinine, Urine: 195.4 mg/dL
Microalb/Creat Ratio: 396 mg/g{creat} — ABNORMAL HIGH (ref 0–29)
Microalbumin, Urine: 774.7 ug/mL

## 2023-08-22 LAB — HGB A1C W/O EAG: Hgb A1c MFr Bld: 10.6 % — ABNORMAL HIGH (ref 4.8–5.6)

## 2023-08-24 ENCOUNTER — Other Ambulatory Visit: Payer: Self-pay

## 2023-08-24 NOTE — Progress Notes (Signed)
 Nona Dell, PA-C requested a referral to Endocrinology.  Faxed 03/328/2025 lab results (Scheduled to complete physical 09/01/2023 with Nona Dell, PA-C) and last office notes to [facility]. Fax #:  [phone]

## 2023-08-24 NOTE — Addendum Note (Signed)
 Addended by: Gardner Candle on: 08/24/2023 11:37 AM   Modules accepted: Orders

## 2023-09-01 ENCOUNTER — Encounter: Payer: Self-pay | Admitting: Physician Assistant

## 2023-09-01 ENCOUNTER — Ambulatory Visit: Payer: Self-pay | Admitting: Physician Assistant

## 2023-09-01 VITALS — BP 158/84 | HR 80 | Temp 97.7°F | Resp 12 | Ht 72.0 in | Wt 249.0 lb

## 2023-09-01 DIAGNOSIS — E119 Type 2 diabetes mellitus without complications: Secondary | ICD-10-CM | POA: Insufficient documentation

## 2023-09-01 NOTE — Progress Notes (Signed)
 [facility] [place] [facility]   ____________________________________________   None    (approximate)  I have reviewed the triage vital signs and the nursing notes.   HISTORY  Chief Complaint No chief complaint on file.   HPI Stephen Miles is a 33 y.o. male patient presents annual physical exam.  Patient was no concerning complaints.         Past Medical History:  Diagnosis Date   Acanthosis    Brain cyst    Cognitive deficits    Dyspepsia    GERD (gastroesophageal reflux disease)    Goiter    Gynecomastia, male    Hyperlipidemia type II    Hypertension    Hypogonadism male    Obesity (BMI 30-39.9)    Organic brain syndrome (chronic)    Prediabetes    Puberty delay     Patient Active Problem List   Diagnosis Date Noted   Acquired cavovarus deformity of right foot 06/05/2015   OSA (obstructive sleep apnea) 02/02/2013   Obesity, morbid (HCC) 09/20/2012   Goiter    Obesity (BMI 30-39.9)    Acanthosis    Hypogonadism male    Dyspepsia    Hyperlipidemia type II    Prediabetes    Brain cyst    GERD (gastroesophageal reflux disease)    Gynecomastia, male    Hypertension    Organic brain syndrome (chronic)    Pre-diabetes 11/11/2010   Essential hypertension, benign 11/11/2010   Obesity 11/11/2010   Goiter 11/11/2010    Past Surgical History:  Procedure Laterality Date   EYE MUSCLE SURGERY     ORCHIOPEXY      Prior to Admission medications   Medication Sig Start Date End Date Taking? Authorizing Provider  Accu-Chek Softclix Lancets lancets SMARTSIG:Topical 07/28/22   [provider]  albuterol (VENTOLIN HFA) 108 (90 Base) MCG/ACT inhaler Inhale 1-2 puffs into the lungs every 6 (six) hours as needed for wheezing or shortness of breath. 01/15/23   Joni Reining, PA-C  Blood Glucose Monitoring Suppl DEVI 1 each by Does not apply route in the morning, at noon, and at bedtime. May substitute to any manufacturer covered by  patient's insurance. 07/28/22   Joni Reining, PA-C  Continuous Glucose Sensor (FREESTYLE LIBRE 3 PLUS SENSOR) MISC Change sensor every 15 days. 04/02/23   Joni Reining, PA-C  lisinopril (ZESTRIL) 5 MG tablet Take 1 tablet (5 mg total) by mouth daily. 01/15/23   Joni Reining, PA-C  metFORMIN (GLUCOPHAGE) 1000 MG tablet Take 1 tablet (1,000 mg total) by mouth 2 (two) times daily with a meal. 07/28/22   Joni Reining, PA-C  pioglitazone (ACTOS) 15 MG tablet Take 1 tablet (15 mg total) by mouth daily. 04/02/23   Joni Reining, PA-C  rosuvastatin (CRESTOR) 40 MG tablet Take 1 tablet (40 mg total) by mouth daily. 01/22/23   Joni Reining, PA-C    Allergies Patient has no known allergies.  Family History  Problem Relation Age of Onset   Diabetes Mother    Diabetes Sister    Thyroid disease Paternal Grandfather    Cancer Paternal Grandmother     Social History Social History   Tobacco Use   Smoking status: Former    Types: Cigarettes   Smokeless tobacco: Never   Tobacco comments:    occasionally  Substance Use Topics   Alcohol use: No   Drug use: No    Review of Systems Constitutional: No fever/chills Eyes: No  visual changes. ENT: No sore throat. Cardiovascular: Denies chest pain. Respiratory: Denies shortness of breath. Gastrointestinal: No abdominal pain.  No nausea, no vomiting.  No diarrhea.  No constipation. Genitourinary: Negative for dysuria. Musculoskeletal: Negative for back pain. Skin: Negative for rash. Neurological: Negative for headaches, focal weakness or numbness. Endocrine: Diabetes, hyperlipidemia, and hypertension.   ____________________________________________   PHYSICAL EXAM:  VITAL SIGNS: BP 158/84BP. 158/84. Data is abnormal. Taken on 09/01/23 9:25 AM  BP Location Left Arm  Patient Position Sitting  Cuff Size Large  Pulse Rate 80  Temp 97.7 F (36.5 C)  Temp Source Temporal  Weight 249 lb (112.9 kg)  Height 6' (1.829 m)  Resp 12  SpO2  98 %   BMI: 33.77 kg/m2  BSA: 2.39 m2   Constitutional: Alert and oriented. Well appearing and in no acute distress. Eyes: Conjunctivae are normal. PERRL. EOMI. Head: Atraumatic. Nose: No congestion/rhinnorhea. Mouth/Throat: Mucous membranes are moist.  Oropharynx non-erythematous. Neck: No stridor. No cervical spine tenderness to palpation. Hematological/Lymphatic/Immunilogical: No cervical lymphadenopathy. Cardiovascular: Normal rate, regular rhythm. Grossly normal heart sounds.  Good peripheral circulation. Respiratory: Normal respiratory effort.  No retractions. Lungs CTAB. Gastrointestinal: Soft and nontender. No distention. No abdominal bruits. No CVA tenderness. Genitourinary: Deferred Musculoskeletal: No lower extremity tenderness nor edema.  No joint effusions. Neurologic:  Normal speech and language. No gross focal neurologic deficits are appreciated. No gait instability. Skin:  Skin is warm, dry and intact. No rash noted. Psychiatric: Mood and affect are normal. Speech and behavior are normal.  ____________________________________________   LABS _     Component Ref Range & Units (hover) 11 d ago 1 yr ago  Color, UA Amber Yellow  Clarity, UA Clear Clear  Glucose, UA Positive Abnormal  Positive Abnormal  CM  Comment: 2+  Bilirubin, UA Positive Negative  Comment: 1+  Ketones, UA Negative Positive CM  Spec Grav, UA >=1.030 Abnormal  >=1.030 Abnormal   Blood, UA Positive Positive CM  Comment: 1+  pH, UA 5.0 5.0  Protein, UA Positive Abnormal  Positive Abnormal   Comment: 2+  Urobilinogen, UA 0.2 0.2  Nitrite, UA Negative Negative  Leukocytes, UA Negative Negative  Appearance    Odor          Specimen Collected: 08/21/23 09:27 Last Resulted: 08/21/23 09:27      Lab Flowsheet      Order Details      View Encounter      Lab and Collection Details      Routing      Result History    View All Conversations on this Encounter    CM=Additional comments     Result Care Coordination   Patient Communication   Add Comments   Not seen Back to Top   Other Results from 08/21/2023   Contains abnormal data CMP12+LP+TP+TSH+6AC+CBC/D/Plt Order: [id number]  Status: Final result     Next appt: None     Dx: Routine physical examination     Test Result Released: No (inaccessible in MyChart)   0 Result Notes     1 HM Topic          Component Ref Range & Units (hover) 11 d ago (08/21/23) 1 yr ago (07/21/22) 11 yr ago (08/05/12) 11 yr ago (08/05/12) 11 yr ago (08/05/12) 12 yr ago (03/26/11) 12 yr ago (03/26/11)  Glucose 255 High  364 High    87    Uric Acid 3.8 2.9 Low  CM  Comment:            Therapeutic target for gout patients: <6.0  BUN 14 8   10  R    Creatinine, Ser 0.56 Low  0.69 Low    0.63 R    eGFR 134 127       BUN/Creatinine Ratio 25 High  12       Sodium 137 137   138 R    Potassium 4.0 4.2   3.8 R    Chloride 98 97   101 R    Calcium 9.5 9.8   9.7 R    Phosphorus 3.7 3.3       Total Protein 7.5 8.1   7.3 R    Albumin 4.5 4.9   4.5 R    Globulin, Total 3.0 3.2       Bilirubin Total 0.3 0.2   0.3 R    Alkaline Phosphatase 69 90   63 R    LDH 198 221       AST 16 13   21  R    ALT 26 26   26  R    GGT 43 44       Iron 85 80       Cholesterol, Total 170 222 High        Triglycerides 277 High  331 High   137 R  220 High  R   HDL 34 Low  40  36 Low   31 Low    VLDL Cholesterol Cal 46 High  58 High        LDL Chol Calc (NIH) 90 124 High        Chol/HDL Ratio 5.0 5.6 High  CM  4.3 R  5.4 R   Comment:                                   T. Chol/HDL Ratio                                             Men  Women                               1/2 Avg.Risk  3.4    3.3                                   Avg.Risk  5.0    4.4                                2X Avg.Risk  9.6    7.1                                3X Avg.Risk 23.4   11.0  Estimated CHD Risk 1.0 1.2 High  CM       Comment: The CHD Risk is based on the T. Chol/HDL  ratio. Other factors affect CHD Risk such as hypertension, smoking, diabetes, severe obesity, and family history of premature CHD.  TSH 2.000 2.460 1.185 R    1.804 R  T4,  Total 7.6 8.5       T3 Uptake Ratio 25 27       Free Thyroxine Index 1.9 2.3       WBC 6.4 8.2       RBC 4.82 5.43       Hemoglobin 14.1 15.7       Hematocrit 41.5 47.0       MCV 86 87       MCH 29.3 28.9       MCHC 34.0 33.4       RDW 12.8 12.8       Platelets 220 245       Neutrophils 55 61       Lymphs 35 30       Monocytes 7 6       Eos 2 2       Basos 1 1       Neutrophils Absolute 3.6 5.0       Lymphocytes Absolute 2.2 2.5       Monocytes Absolute 0.5 0.5       EOS (ABSOLUTE) 0.1 0.2       Basophils Absolute 0.0 0.0       Immature Granulocytes 0 0       Immature Grans (Abs) 0.0 0.0       Resulting Agency [facility]          View All Conversations on this Encounter                   Component Ref Range & Units (hover) 11 d ago (08/21/23) 5 mo ago (04/02/23) 1 yr ago (07/21/22) 10 yr ago (09/20/12) 11 yr ago (12/01/11) 12 yr ago (08/05/11) 12 yr ago (04/07/11)  Hgb A1c MFr Bld 10.6 High  10.2 Abnormal  R 11.2 High  CM 5.6 R 5.0 R, CM 5.4 R, CM 5.9 R  Comment:          Prediabetes: 5.7 - 6.4          Diabetes: >6.4          Glycemic control for adults with diabetes: <7.0  HbA1c POC (<> result, manual entry)                    ___________________________________________    ____________________________________________   INITIAL IMPRESSION / ASSESSMENT AND PLAN  As part of my medical decision making, I reviewed the following data within the electronic MEDICAL RECORD NUMBER      No acute findings on physical exam.  Labs continue to show glucosuria and proteinuria.  Hemoglobin A1c continues to be elevated.  Patient lipid profile has improved status post starting Crestor.  Patient will be consulted to endocrinology for definitive evaluation and  treatment for diabetes.     ____________________________________________   FINAL CLINICAL IMPRESSION  Well exam  ED Discharge Orders     None        Note:  This document was prepared using Dragon voice recognition software and may include unintentional dictation errors.

## 2023-09-01 NOTE — Addendum Note (Signed)
 Addended by: Gardner Candle on: 09/01/2023 12:27 PM   Modules accepted: Orders

## 2023-09-15 DIAGNOSIS — R809 Proteinuria, unspecified: Secondary | ICD-10-CM | POA: Diagnosis not present

## 2023-09-15 DIAGNOSIS — E669 Obesity, unspecified: Secondary | ICD-10-CM | POA: Diagnosis not present

## 2023-09-15 DIAGNOSIS — E1169 Type 2 diabetes mellitus with other specified complication: Secondary | ICD-10-CM | POA: Diagnosis not present

## 2023-09-15 DIAGNOSIS — E1159 Type 2 diabetes mellitus with other circulatory complications: Secondary | ICD-10-CM | POA: Diagnosis not present

## 2023-09-15 DIAGNOSIS — E785 Hyperlipidemia, unspecified: Secondary | ICD-10-CM | POA: Diagnosis not present

## 2023-09-15 DIAGNOSIS — E1165 Type 2 diabetes mellitus with hyperglycemia: Secondary | ICD-10-CM | POA: Diagnosis not present

## 2023-09-15 DIAGNOSIS — E1129 Type 2 diabetes mellitus with other diabetic kidney complication: Secondary | ICD-10-CM | POA: Diagnosis not present

## 2023-09-15 DIAGNOSIS — I152 Hypertension secondary to endocrine disorders: Secondary | ICD-10-CM | POA: Diagnosis not present

## 2023-10-14 ENCOUNTER — Other Ambulatory Visit: Payer: Self-pay

## 2023-10-14 DIAGNOSIS — E7801 Familial hypercholesterolemia: Secondary | ICD-10-CM

## 2023-10-14 MED ORDER — ROSUVASTATIN CALCIUM 40 MG PO TABS: 40.0000 mg | ORAL_TABLET | Freq: Every day | ORAL | 3 refills | Status: AC

## 2024-06-28 ENCOUNTER — Ambulatory Visit: Payer: Self-pay | Admitting: Physician Assistant

## 2024-06-28 ENCOUNTER — Telehealth: Payer: Self-pay

## 2024-06-28 ENCOUNTER — Encounter: Payer: Self-pay | Admitting: Physician Assistant

## 2024-06-28 VITALS — Temp 97.5°F

## 2024-06-28 DIAGNOSIS — N5089 Other specified disorders of the male genital organs: Secondary | ICD-10-CM

## 2024-06-28 MED ORDER — LIDOCAINE 5 % EX OINT: 1.0000 | TOPICAL_OINTMENT | CUTANEOUS | 0 refills | Status: AC | PRN

## 2024-06-28 MED ORDER — SULFAMETHOXAZOLE-TRIMETHOPRIM 800-160 MG PO TABS: 1.0000 | ORAL_TABLET | Freq: Two times a day (BID) | ORAL | 0 refills | Status: AC

## 2024-06-28 NOTE — Progress Notes (Signed)
 Reports an area of concern he wants provider to eval x 1 day of a bump on the skin reportedly between the scrotum and anus not seen by nurse.

## 2024-06-28 NOTE — Progress Notes (Signed)
" ° °  Subjective:Cyst    Patient ID: Stephen Miles, male    DOB: 1991-03-16, 34 y.o.   MRN: [id number]  HPI Patient states painful lesion between buttocks which occurred 1 day ago.  States patient pain increased with sitting.  Denies drainage.  Area is tender to palpation.   Review of Systems Diabetes, GERD, hyperlipidemia, hypertension, hypogonadism, and sleep apnea.   Objective:   Physical Exam Edematous and erythematous cystic lesion mid perineum area.  No drainage.  Tender to palpation.      Assessment & Plan:perineum cyst   Patient advised sitz bath's daily.  Patient given a prescription for Bactrim  DS and topical lidocaine .  Patient advised to follow-up in 1 week if no improvement or worsening of complaint. "

## 2024-07-04 ENCOUNTER — Ambulatory Visit: Admitting: Physician Assistant
